# Patient Record
Sex: Female | Born: 2000 | Race: White | Hispanic: No | Marital: Married | Smoking: Current some day smoker
Health system: Southern US, Community
[De-identification: ages and names within clinical notes are randomized; demographics above are authoritative.]

## PROBLEM LIST (undated history)

## (undated) ENCOUNTER — Inpatient Hospital Stay: Payer: Self-pay

## (undated) ENCOUNTER — Inpatient Hospital Stay

## (undated) DIAGNOSIS — G43909 Migraine, unspecified, not intractable, without status migrainosus: Secondary | ICD-10-CM

## (undated) HISTORY — DX: Migraine, unspecified, not intractable, without status migrainosus: G43.909

## (undated) HISTORY — PX: TYMPANOSTOMY TUBE PLACEMENT: SHX32

---
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
## 2020-05-21 DIAGNOSIS — N3000 Acute cystitis without hematuria: Secondary | ICD-10-CM

## 2020-05-21 HISTORY — DX: Acute cystitis without hematuria: N30.00

## 2020-06-08 ENCOUNTER — Other Ambulatory Visit: Payer: Self-pay

## 2020-06-08 ENCOUNTER — Encounter (HOSPITAL_COMMUNITY): Payer: Self-pay

## 2020-06-08 ENCOUNTER — Emergency Department (HOSPITAL_COMMUNITY)
Admission: EM | Admit: 2020-06-08 | Discharge: 2020-06-09 | Disposition: A | Discharge status: Home / Self Care | Payer: Medicaid Other | Attending: Emergency Medicine | Admitting: Emergency Medicine

## 2020-06-08 DIAGNOSIS — N390 Urinary tract infection, site not specified: Secondary | ICD-10-CM | POA: Diagnosis not present

## 2020-06-08 DIAGNOSIS — R102 Pelvic and perineal pain: Secondary | ICD-10-CM | POA: Diagnosis present

## 2020-06-08 DIAGNOSIS — N3000 Acute cystitis without hematuria: Secondary | ICD-10-CM

## 2020-06-08 DIAGNOSIS — F17210 Nicotine dependence, cigarettes, uncomplicated: Secondary | ICD-10-CM | POA: Insufficient documentation

## 2020-06-08 NOTE — ED Triage Notes (Signed)
Pt reports suprapubic abdominal pain for 2 days. Pt also c/o a white discharge for 2 days. Missed last menstrual cycle.

## 2020-06-09 ENCOUNTER — Emergency Department (HOSPITAL_COMMUNITY): Payer: Medicaid Other

## 2020-06-09 LAB — URINALYSIS, ROUTINE W REFLEX MICROSCOPIC
Bilirubin Urine: NEGATIVE
Glucose, UA: NEGATIVE mg/dL
Ketones, ur: NEGATIVE mg/dL
Nitrite: NEGATIVE
Protein, ur: NEGATIVE mg/dL
Specific Gravity, Urine: 1.021 (ref 1.005–1.030)
pH: 6 (ref 5.0–8.0)

## 2020-06-09 LAB — WET PREP, GENITAL
Clue Cells Wet Prep HPF POC: NONE SEEN
Sperm: NONE SEEN
Trich, Wet Prep: NONE SEEN
Yeast Wet Prep HPF POC: NONE SEEN

## 2020-06-09 LAB — GC/CHLAMYDIA PROBE AMP (~~LOC~~) NOT AT ARMC
Chlamydia: NEGATIVE
Comment: NEGATIVE
Comment: NORMAL
Neisseria Gonorrhea: NEGATIVE

## 2020-06-09 LAB — PREGNANCY, URINE: Preg Test, Ur: NEGATIVE

## 2020-06-09 MED ORDER — SULFAMETHOXAZOLE-TRIMETHOPRIM 800-160 MG PO TABS: 1.0000 | ORAL_TABLET | Freq: Two times a day (BID) | ORAL | 0 refills | Status: AC

## 2020-06-09 MED ORDER — CEFTRIAXONE SODIUM 250 MG IJ SOLR
250.0000 mg | Freq: Once | INTRAMUSCULAR | Status: AC
  Administered 2020-06-09: 250 mg via INTRAMUSCULAR
  Filled 2020-06-09: qty 250

## 2020-06-09 MED ORDER — AZITHROMYCIN 250 MG PO TABS
1000.0000 mg | ORAL_TABLET | Freq: Once | ORAL | Status: AC
  Administered 2020-06-09: 1000 mg via ORAL
  Filled 2020-06-09: qty 4

## 2020-06-09 NOTE — ED Notes (Addendum)
Assumed care of this patient. Vitals taken. A&Ox4. NAD. Respirations regular/unlabored. Connected to BP and Pulse Ox. Stretcher low, wheels locked, call bell within reach. Significant other at bedside.
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
## 2020-06-09 NOTE — ED Notes (Signed)
Pelvic exam set up and ready. Pt made aware that urine sample is needed.

## 2020-06-09 NOTE — Discharge Instructions (Addendum)
Take the antibiotic as directed twice daily until gone.   Follow up with your doctor as needed.

## 2020-10-08 ENCOUNTER — Other Ambulatory Visit: Payer: Self-pay

## 2020-10-08 ENCOUNTER — Ambulatory Visit: Payer: Medicaid Other

## 2020-10-08 ENCOUNTER — Ambulatory Visit (LOCAL_COMMUNITY_HEALTH_CENTER): Payer: Medicaid Other | Admitting: Family Medicine

## 2020-10-08 ENCOUNTER — Encounter: Payer: Self-pay | Admitting: Family Medicine

## 2020-10-08 VITALS — BP 111/73 | HR 89 | Temp 98.3°F | Resp 16 | Ht 66.0 in | Wt 179.0 lb

## 2020-10-08 DIAGNOSIS — Z3009 Encounter for other general counseling and advice on contraception: Secondary | ICD-10-CM

## 2020-10-08 DIAGNOSIS — B009 Herpesviral infection, unspecified: Secondary | ICD-10-CM

## 2020-10-08 DIAGNOSIS — Z0000 Encounter for general adult medical examination without abnormal findings: Secondary | ICD-10-CM

## 2020-10-08 DIAGNOSIS — Z30017 Encounter for initial prescription of implantable subdermal contraceptive: Secondary | ICD-10-CM

## 2020-10-08 DIAGNOSIS — F32A Depression, unspecified: Secondary | ICD-10-CM

## 2020-10-08 MED ORDER — ETONOGESTREL 68 MG ~~LOC~~ IMPL
68.0000 mg | DRUG_IMPLANT | Freq: Once | SUBCUTANEOUS | Status: AC
  Administered 2020-10-08: 68 mg via SUBCUTANEOUS

## 2020-10-11 ENCOUNTER — Encounter: Payer: Self-pay | Admitting: Family Medicine

## 2020-10-11 DIAGNOSIS — F32A Depression, unspecified: Secondary | ICD-10-CM

## 2020-10-11 DIAGNOSIS — B009 Herpesviral infection, unspecified: Secondary | ICD-10-CM | POA: Insufficient documentation

## 2020-10-11 HISTORY — DX: Herpesviral infection, unspecified: B00.9

## 2020-10-11 HISTORY — DX: Depression, unspecified: F32.A

## 2020-10-11 MED ORDER — ACYCLOVIR 800 MG PO TABS: 800.0000 mg | ORAL_TABLET | Freq: Two times a day (BID) | ORAL | 12 refills | Status: AC
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
## 2020-10-20 ENCOUNTER — Ambulatory Visit: Payer: Self-pay | Admitting: Licensed Clinical Social Worker
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
## 2020-10-25 ENCOUNTER — Emergency Department (HOSPITAL_COMMUNITY): Payer: No Typology Code available for payment source

## 2020-10-25 ENCOUNTER — Other Ambulatory Visit: Payer: Self-pay

## 2020-10-25 ENCOUNTER — Emergency Department (HOSPITAL_COMMUNITY)
Admission: EM | Admit: 2020-10-25 | Discharge: 2020-10-25 | Disposition: A | Discharge status: Home / Self Care | Payer: No Typology Code available for payment source | Attending: Emergency Medicine | Admitting: Emergency Medicine

## 2020-10-25 DIAGNOSIS — M25571 Pain in right ankle and joints of right foot: Secondary | ICD-10-CM | POA: Diagnosis not present

## 2020-10-25 DIAGNOSIS — Z87891 Personal history of nicotine dependence: Secondary | ICD-10-CM | POA: Insufficient documentation

## 2020-10-25 DIAGNOSIS — R0789 Other chest pain: Secondary | ICD-10-CM | POA: Diagnosis not present

## 2020-10-25 DIAGNOSIS — Y92410 Unspecified street and highway as the place of occurrence of the external cause: Secondary | ICD-10-CM | POA: Diagnosis not present

## 2020-10-25 DIAGNOSIS — R519 Headache, unspecified: Secondary | ICD-10-CM | POA: Insufficient documentation

## 2020-10-25 DIAGNOSIS — S3991XA Unspecified injury of abdomen, initial encounter: Secondary | ICD-10-CM | POA: Insufficient documentation

## 2020-10-25 LAB — COMPREHENSIVE METABOLIC PANEL
ALT: 16 U/L (ref 0–44)
AST: 21 U/L (ref 15–41)
Albumin: 4.1 g/dL (ref 3.5–5.0)
Alkaline Phosphatase: 69 U/L (ref 38–126)
Anion gap: 11 (ref 5–15)
BUN: 11 mg/dL (ref 6–20)
CO2: 24 mmol/L (ref 22–32)
Calcium: 9.5 mg/dL (ref 8.9–10.3)
Chloride: 105 mmol/L (ref 98–111)
Creatinine, Ser: 0.75 mg/dL (ref 0.44–1.00)
GFR, Estimated: >60 mL/min (ref >60)
Glucose, Bld: 100 mg/dL — ABNORMAL HIGH (ref 70–99)
Potassium: 3.8 mmol/L (ref 3.5–5.1)
Sodium: 140 mmol/L (ref 135–145)
Total Bilirubin: 0.8 mg/dL (ref 0.3–1.2)
Total Protein: 7.2 g/dL (ref 6.5–8.1)

## 2020-10-25 LAB — CBC WITH DIFFERENTIAL/PLATELET
Abs Immature Granulocytes: 0.02 10*3/uL (ref 0.00–0.07)
Basophils Absolute: 0 10*3/uL (ref 0.0–0.1)
Basophils Relative: 1 %
Eosinophils Absolute: 0.1 10*3/uL (ref 0.0–0.5)
Eosinophils Relative: 1 %
HCT: 41 % (ref 36.0–46.0)
Hemoglobin: 13.7 g/dL (ref 12.0–15.0)
Immature Granulocytes: 0 %
Lymphocytes Relative: 25 %
Lymphs Abs: 1.9 10*3/uL (ref 0.7–4.0)
MCH: 30 pg (ref 26.0–34.0)
MCHC: 33.4 g/dL (ref 30.0–36.0)
MCV: 89.7 fL (ref 80.0–100.0)
Monocytes Absolute: 0.5 10*3/uL (ref 0.1–1.0)
Monocytes Relative: 7 %
Neutro Abs: 5.3 10*3/uL (ref 1.7–7.7)
Neutrophils Relative %: 66 %
Platelets: 231 10*3/uL (ref 150–400)
RBC: 4.57 MIL/uL (ref 3.87–5.11)
RDW: 12.1 % (ref 11.5–15.5)
WBC: 7.9 10*3/uL (ref 4.0–10.5)
nRBC: 0 % (ref 0.0–0.2)

## 2020-10-25 LAB — I-STAT BETA HCG BLOOD, ED (MC, WL, AP ONLY): I-stat hCG, quantitative: <5 m[IU]/mL (ref <5)

## 2020-10-25 MED ORDER — BACITRACIN ZINC 500 UNIT/GM EX OINT
1.0000 "application " | TOPICAL_OINTMENT | Freq: Once | CUTANEOUS | Status: AC
  Administered 2020-10-25: 1 via TOPICAL
  Filled 2020-10-25: qty 1.8

## 2020-10-25 MED ORDER — NAPROXEN 375 MG PO TABS: 375.0000 mg | ORAL_TABLET | Freq: Two times a day (BID) | ORAL | 0 refills | Status: DC

## 2020-10-25 MED ORDER — CYCLOBENZAPRINE HCL 10 MG PO TABS: 10.0000 mg | ORAL_TABLET | Freq: Two times a day (BID) | ORAL | 0 refills | Status: DC | PRN

## 2020-10-25 MED ORDER — IOHEXOL 350 MG/ML SOLN
75.0000 mL | Freq: Once | INTRAVENOUS | Status: AC | PRN
  Administered 2020-10-25: 75 mL via INTRAVENOUS

## 2020-10-25 MED ORDER — MORPHINE SULFATE (PF) 4 MG/ML IV SOLN
4.0000 mg | Freq: Once | INTRAVENOUS | Status: AC
  Administered 2020-10-25: 4 mg via INTRAVENOUS
  Filled 2020-10-25: qty 1
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
## 2020-10-25 NOTE — ED Notes (Signed)
Pt has gone and come back from ct
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
## 2020-10-25 NOTE — ED Notes (Addendum)
Pt care taken, alert and oriented, waiting on ct scans. Cleaned abraisions

## 2020-10-25 NOTE — Discharge Instructions (Signed)
Take the medications as needed for aches and pains.  Follow-up with your doctor if the symptoms do not resolve over the next week

## 2020-12-22 ENCOUNTER — Ambulatory Visit: Payer: Medicaid Other

## 2020-12-28 ENCOUNTER — Ambulatory Visit (LOCAL_COMMUNITY_HEALTH_CENTER): Payer: Medicaid Other | Admitting: Physician Assistant

## 2020-12-28 ENCOUNTER — Other Ambulatory Visit: Payer: Self-pay

## 2020-12-28 VITALS — BP 110/77 | Ht 66.0 in | Wt 169.0 lb

## 2020-12-28 DIAGNOSIS — Z3046 Encounter for surveillance of implantable subdermal contraceptive: Secondary | ICD-10-CM

## 2020-12-28 DIAGNOSIS — Z3009 Encounter for other general counseling and advice on contraception: Secondary | ICD-10-CM | POA: Diagnosis not present

## 2020-12-29 NOTE — Progress Notes (Signed)
[facility] problem [facility]  Subjective:  Danielle Leon is a 20 y.o. being seen today for Nexplanon removal.   Chief Complaint  Patient presents with   Contraception    Removal    HPI Patient states that she would like the Nexplanon removed to to arm pain at the site.  Patient states that she had Nexplanon in the past and that she had to have it removed due to "non stop bleeding".  Patient states that she plans to use condoms as her [facility] until she decides whether she wants to use another hormonal method.  Does the patient have a current or past history of drug use? No   No components found for: HCV]   Health Maintenance Due  Topic Date Due   HPV VACCINES (1 - 2-dose series) Never done   Hepatitis C Screening  Never done   COVID-19 Vaccine (3 - Booster for Pfizer series) 08/27/2019   INFLUENZA VACCINE  09/20/2020    Review of Systems  All other systems reviewed and are negative.  The following portions of the patient's history were reviewed and updated as appropriate: allergies, current medications, past family history, past medical history, past social history, past surgical history and problem list. Problem list updated.   See flowsheet for other program required questions.  Objective:   Vitals:   12/28/20 0912  BP: 110/77  Weight: 169 lb (76.7 kg)  Height: 5\' 6"  (1.676 m)    Physical Exam Vitals and nursing note reviewed.  Constitutional:      General: She is not in acute distress.    Appearance: Normal appearance.  HENT:     Head: Normocephalic and atraumatic.  Eyes:     Conjunctiva/sclera: Conjunctivae normal.  Pulmonary:     Effort: Pulmonary effort is normal.  Neurological:     Mental Status: She is alert and oriented to person, place, and time.  Psychiatric:        Mood and Affect: Mood normal.        Behavior: Behavior normal.        Thought Content: Thought content normal.        Judgment:  Judgment normal.      Assessment and Plan:  Danielle Leon is a 20 y.o. female presenting to the [facility] Department for a Women's Health problem visit  1. Encounter for counseling regarding contraception Reviewed with patient re: BCM options. Reviewed with patient to call clinic is she decides to use another method. Enc to use OTC spermicide with condoms for added effectiveness if she has one partner. Enc condoms with all sex for STD protection.   2. Nexplanon removal Nexplanon Removal Patient identified, informed consent performed, consent signed.   Appropriate time out taken. Nexplanon site identified.  Area prepped in usual sterile fashon. 3 ml of 1% lidocaine with Epinephrine was used to anesthetize the area at the distal end of the implant and along implant site. A small stab incision was made right beside the implant on the distal portion.  The Nexplanon rod was grasped manually  and removed without difficulty.  There was minimal blood loss. There were no complications.  Steri-strips were applied over the small incision.  A pressure bandage was applied to reduce any bruising.  The patient tolerated the procedure well and was given post procedure instructions.    Counseled patient to take OTC analgesic starting as soon as lidocaine starts to wear off and  take regularly for at least 48 hr to decrease discomfort.  Specifically to take with food or milk to decrease stomach upset and for IB 600 mg (3 tablets) every 6 hrs; IB 800 mg (4 tablets) every 8 hrs; or Aleve 2 tablets every 12 hrs.       Return in about 9 months (around 09/27/2021) for RP and prn.  No future appointments.  Matt Holmes, PA

## 2021-03-06 ENCOUNTER — Ambulatory Visit
Admission: EM | Admit: 2021-03-06 | Discharge: 2021-03-06 | Disposition: A | Discharge status: Home / Self Care | Payer: Medicaid Other | Attending: Emergency Medicine | Admitting: Emergency Medicine

## 2021-03-06 ENCOUNTER — Other Ambulatory Visit: Payer: Self-pay

## 2021-03-06 DIAGNOSIS — U071 COVID-19: Secondary | ICD-10-CM | POA: Diagnosis present

## 2021-03-06 DIAGNOSIS — Z3201 Encounter for pregnancy test, result positive: Secondary | ICD-10-CM

## 2021-03-06 LAB — PREGNANCY, URINE: Preg Test, Ur: POSITIVE — AB

## 2021-03-06 MED ORDER — DOXYLAMINE-PYRIDOXINE 10-10 MG PO TBEC: DELAYED_RELEASE_TABLET | ORAL | 1 refills | Status: DC

## 2021-03-06 MED ORDER — LIDOCAINE VISCOUS HCL 2 % MT SOLN: 15.0000 mL | OROMUCOSAL | 0 refills | Status: DC | PRN
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
## 2021-03-06 NOTE — Discharge Instructions (Signed)
Your COVID test is pending, you will be notified of positive test results  Inside your packet is a list of over-the-counter medications that are safe for you during pregnancy  Please notify your gynecologist that you are pregnant so that you can get an appointment to start prenatal care  Please continue use of your prenatal vitamins  Please follow instructions for your nausea medicine on how to take  May follow up with urgent care as needed

## 2021-03-06 NOTE — ED Triage Notes (Addendum)
Patient is here for "Pregnancy test" & "COVID19 test, Positive today". Pregnancy test at home "Positive, Last Month". Current symptoms "ha, runny nose, s/t" (started today @ 10am). No fever.

## 2021-03-07 LAB — SARS CORONAVIRUS 2 (TAT 6-24 HRS): SARS Coronavirus 2: POSITIVE — AB

## 2021-04-03 DIAGNOSIS — Z3480 Encounter for supervision of other normal pregnancy, unspecified trimester: Secondary | ICD-10-CM | POA: Insufficient documentation

## 2021-04-03 DIAGNOSIS — Z8619 Personal history of other infectious and parasitic diseases: Secondary | ICD-10-CM | POA: Insufficient documentation

## 2021-09-15 DIAGNOSIS — F17200 Nicotine dependence, unspecified, uncomplicated: Secondary | ICD-10-CM | POA: Insufficient documentation

## 2022-02-05 ENCOUNTER — Telehealth: Payer: Medicaid Other | Admitting: Family Medicine

## 2022-02-05 DIAGNOSIS — U071 COVID-19: Secondary | ICD-10-CM

## 2022-02-05 MED ORDER — PREDNISONE 20 MG PO TABS
20.0000 mg | ORAL_TABLET | Freq: Two times a day (BID) | ORAL | 0 refills | Status: AC
Start: 2022-02-05 — End: 2022-02-10

## 2022-02-05 MED ORDER — BENZONATATE 200 MG PO CAPS: 200.0000 mg | ORAL_CAPSULE | Freq: Two times a day (BID) | ORAL | 0 refills | Status: DC | PRN

## 2022-02-05 NOTE — Patient Instructions (Signed)
Quarantine and Isolation Quarantine and isolation refer to local and travel restrictions to protect the public and travelers from contagious diseases that constitute a public health threat. Contagious diseases are diseases that can spread from one person to another. Quarantine and isolation help to protect the public by preventing exposure to people who have or may have a contagious disease. Isolation separates people who are sick with a contagious disease from people who are not sick. Quarantine separates and restricts the movement of people who were exposed to a contagious disease to see if they become sick. You may be put in quarantine or isolation if you have been exposed to or diagnosed with any of the following diseases: Severe acute respiratory syndromes, such as COVID-19. Cholera. Diphtheria. Tuberculosis. Plague. Smallpox. Yellow fever. Viral hemorrhagic fevers, such as [place], Ebola, and [place]. When to quarantine or isolate Follow these rules, whether you have been vaccinated or not: Stay home and isolate from others when you are sick with a contagious disease. Isolate when you test positive for a contagious disease, even if you do not have symptoms. Isolate if you are sick and suspect that you may have a contagious disease. If you suspect that you have a contagious disease, get tested. If your test results are negative, you can end your isolation. If your test results are positive, follow the full isolation recommendations as told by your health care provider or local health authorities. Quarantine and stay away from others when you have been in close contact with someone who has tested positive for a contagious disease. Close contact is defined as being less than 6 ft (1.8 m) away from an infected person for a total of 15 minutes or more over a 24-hour period. Do not go to places where you are unable to wear a mask, such as restaurants and some gyms. Stay home and separate  from others as much as possible. Avoid being around people who may get very sick from the contagious disease that you have. Use a separate bathroom, if possible. Do not travel. For travel guidance, visit the CDC's travel webpage at wwwnc.cdc.gov/travel/ Follow these instructions at home: Medicines  Take over-the-counter and prescription medicines as told by your health care provider. Finish all antibiotic medicine even when you start to feel better. Stay up to date with all your vaccines. Get scheduled vaccines and boosters as recommended by your health care provider. Lifestyle Wear a high-quality mask if you must be around others at home and in public, if recommended. Improve air flow (ventilation) at home to help prevent the disease from spreading to other people, if possible. Do not share personal household items, like cups, towels, and utensils. Practice everyday hygiene and cleaning. General instructions Talk to your health care provider if you have a weakened body defense system (immune system). People with a weakened immune system may have a reduced immune response to vaccines. You may need to follow current prevention measures, including wearing a well-fitting mask, avoiding crowds, and avoiding poorly ventilated indoor places. Monitor symptoms and follow health care provider instructions, which may include resting, drinking fluids, and taking medicines. Follow specific isolation and quarantine recommendations if you are in places that can lead to disease outbreaks, such as correctional and detention facilities, homeless shelters, and cruise ships. Return to your normal activities as told by your health care provider. Ask your health care provider what activities are safe for you. Keep all follow-up visits. This is important. Where to find more information CDC: www.cdc.gov/quarantine/index.html Contact   a health care provider if: You have a fever. You have signs and symptoms that  return or get worse after isolation. Get help right away if: You have difficulty breathing. You have chest pain. These symptoms may be an emergency. Get help right away. Call 911. Do not wait to see if the symptoms will go away. Do not drive yourself to the hospital. Summary Isolation and quarantine help protect the public by preventing exposure to people who have or may have a contagious disease. Isolate when you are sick or when you test positive, even if you do not have symptoms. Quarantine and stay away from others when you have been in close contact with someone who has tested positive for a contagious disease. This information is not intended to replace advice given to you by your health care provider. Make sure you discuss any questions you have with your health care provider. Document Revised: 02/17/2021 Document Reviewed: 01/27/2021 Elsevier Patient Education  2023 [employer] COVID-19 COVID-19, or coronavirus disease 2019, is an infection that is caused by a new (novel) coronavirus called SARS-CoV-2. COVID-19 can cause many symptoms. In some people, the virus may not cause any symptoms. In others, it may cause mild or severe symptoms. Some people with severe infection develop severe disease. What are the causes? This illness is caused by a virus. The virus may be in the air as tiny specks of fluid (aerosols) or droplets, or it may be on surfaces. You may catch the virus by: Breathing in droplets from an infected person. Droplets can be spread by a person breathing, speaking, singing, coughing, or sneezing. Touching something, like a table or a doorknob, that has virus on it (is contaminated) and then touching your mouth, nose, or eyes. What increases the risk? Risk for infection: You are more likely to get infected with the COVID-19 virus if: You are within 6 ft (1.8 m) of a person with COVID-19 for 15 minutes or longer. You are providing care for a person who is infected with  COVID-19. You are in close personal contact with other people. Close personal contact includes hugging, kissing, or sharing eating or drinking utensils. Risk for serious illness caused by COVID-19: You are more likely to get seriously ill from the COVID-19 virus if: You have cancer. You have a long-term (chronic) disease, such as: Chronic lung disease. This includes pulmonary embolism, chronic obstructive pulmonary disease, and cystic fibrosis. Long-term disease that lowers your body's ability to fight infection (immunocompromise). Serious cardiac conditions, such as heart failure, coronary artery disease, or cardiomyopathy. Diabetes. Chronic kidney disease. Liver diseases. These include cirrhosis, nonalcoholic fatty liver disease, alcoholic liver disease, or autoimmune hepatitis. You have obesity. You are pregnant or were recently pregnant. You have sickle cell disease. What are the signs or symptoms? Symptoms of this condition can range from mild to severe. Symptoms may appear any time from 2 to 14 days after being exposed to the virus. They include: Fever or chills. Shortness of breath or trouble breathing. Feeling tired or very tired. Headaches, body aches, or muscle aches. Runny or stuffy nose, sneezing, coughing, or sore throat. New loss of taste or smell. This is rare. Some people may also have stomach problems, such as nausea, vomiting, or diarrhea. Other people may not have any symptoms of COVID-19. How is this diagnosed? This condition may be diagnosed by testing samples to check for the COVID-19 virus. The most common tests are the PCR test and the antigen test. Tests may be done   in the lab or at home. They include: Using a swab to take a sample of fluid from the back of your nose and throat (nasopharyngeal fluid), from your nose, or from your throat. Testing a sample of saliva from your mouth. Testing a sample of coughed-up mucus from your lungs (sputum). How is this  treated? Treatment for COVID-19 infection depends on the severity of the condition. Mild symptoms can be managed at home with rest, fluids, and over-the-counter medicines. Serious symptoms may be treated in a hospital intensive care unit (ICU). Treatment in the ICU may include: Supplemental oxygen. Extra oxygen is given through a tube in the nose, a face mask, or a hood. Medicines. These may include: Antivirals, such as monoclonal antibodies. These help your body fight off certain viruses that can cause disease. Anti-inflammatories, such as corticosteroids. These reduce inflammation and suppress the immune system. Antithrombotics. These prevent or treat blood clots, if they develop. Convalescent plasma. This helps boost your immune system, if you have an underlying immunosuppressive condition or are getting immunosuppressive treatments. Prone positioning. This means you will lie on your stomach. This helps oxygen to get into your lungs. Infection control measures. If you are at risk for more serious illness caused by COVID-19, your health care provider may prescribe two long-acting monoclonal antibodies, given together every 6 months. How is this prevented? To protect yourself: Use preventive medicine (pre-exposure prophylaxis). You may get pre-exposure prophylaxis if you have moderate or severe immunocompromise. Get vaccinated. Anyone 6 months old or older who meets guidelines can get a COVID-19 vaccine or vaccine series. This includes people who are pregnant or making breast milk (lactating). Get an added dose of COVID-19 vaccine after your first vaccine or vaccine series if you have moderate to severe immunocompromise. This applies if you have had a solid organ transplant or have been diagnosed with an immunocompromising condition. You should get the added dose 4 weeks after you got the first COVID-19 vaccine or vaccine series. If you get an mRNA vaccine, you will need a 3-dose primary  series. If you get the J&J/Janssen vaccine, you will need a 2-dose primary series, with the second dose being an mRNA vaccine. Talk to your health care provider about getting experimental monoclonal antibodies. This treatment is approved under emergency use authorization to prevent severe illness before or after being exposed to the COVID-19 virus. You may be given monoclonal antibodies if: You have moderate or severe immunocompromise. This includes treatments that lower your immune response. People with immunocompromise may not develop protection against COVID-19 when they are vaccinated. You cannot be vaccinated. You may not get a vaccine if you have a severe allergic reaction to the vaccine or its components. You are not fully vaccinated. You are in a facility where COVID-19 is present and: Are in close contact with a person who is infected with the COVID-19 virus. Are at high risk of being exposed to the COVID-19 virus. You are at risk of illness from new variants of the COVID-19 virus. To protect others: If you have symptoms of COVID-19, take steps to prevent the virus from spreading to others. Stay home. Leave your house only to get medical care. Do not use public transit, if possible. Do not travel while you are sick. Wash your hands often with soap and water for at least 20 seconds. If soap and water are not available, use alcohol-based hand sanitizer. Make sure that all people in your household wash their hands well and often. Cough or sneeze   into a tissue or your sleeve or elbow. Do not cough or sneeze into your hand or into the air. Where to find more information Centers for Disease Control and Prevention: www.cdc.gov/coronavirus World Health Organization: www.who.int/health-topics/coronavirus Get help right away if: You have trouble breathing. You have pain or pressure in your chest. You are confused. You have bluish lips and fingernails. You have trouble waking from sleep. You  have symptoms that get worse. These symptoms may be an emergency. Get help right away. Call 911. Do not wait to see if the symptoms will go away. Do not drive yourself to the hospital. Summary COVID-19 is an infection that is caused by a new coronavirus. Sometimes, there are no symptoms. Other times, symptoms range from mild to severe. Some people with a severe COVID-19 infection develop severe disease. The virus that causes COVID-19 can spread from person to person through droplets or aerosols from breathing, speaking, singing, coughing, or sneezing. Mild symptoms of COVID-19 can be managed at home with rest, fluids, and over-the-counter medicines. This information is not intended to replace advice given to you by your health care provider. Make sure you discuss any questions you have with your health care provider. Document Revised: 01/25/2021 Document Reviewed: 01/27/2021 Elsevier Patient Education  2023 Else[employer] Inc.  
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
## 2022-03-28 DIAGNOSIS — O99210 Obesity complicating pregnancy, unspecified trimester: Secondary | ICD-10-CM | POA: Insufficient documentation

## 2022-03-28 DIAGNOSIS — O34219 Maternal care for unspecified type scar from previous cesarean delivery: Secondary | ICD-10-CM | POA: Insufficient documentation

## 2022-06-05 ENCOUNTER — Ambulatory Visit
Admission: EM | Admit: 2022-06-05 | Discharge: 2022-06-05 | Disposition: A | Discharge status: Home / Self Care | Payer: Medicaid Other | Attending: Family Medicine | Admitting: Family Medicine

## 2022-06-05 ENCOUNTER — Encounter: Payer: Self-pay | Admitting: Emergency Medicine

## 2022-06-05 DIAGNOSIS — J069 Acute upper respiratory infection, unspecified: Secondary | ICD-10-CM | POA: Diagnosis present

## 2022-06-05 LAB — GROUP A STREP BY PCR: Group A Strep by PCR: NOT DETECTED

## 2022-06-05 NOTE — ED Triage Notes (Signed)
Pt presents with a sore throat and bilateral ear pain that started today.
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
## 2022-06-05 NOTE — Discharge Instructions (Signed)
Strep test is negative.  You can take Tylenol and/or Ibuprofen as needed for fever reduction and pain relief.    For cough: honey 1/2 to 1 teaspoon (you can dilute the honey in water or another fluid).  You can also use guaifenesin and dextromethorphan for cough. You can use a humidifier for chest congestion and cough.  If you don't have a humidifier, you can sit in the bathroom with the hot shower running.      For sore throat: try warm salt water gargles, Mucinex sore throat cough drops or cepacol lozenges, throat spray, warm tea or water with lemon/honey, popsicles or ice, or OTC cold relief medicine for throat discomfort. You can also purchase chloraseptic spray at the pharmacy or dollar store.   For congestion: take a daily anti-histamine like Zyrtec, Claritin, and a oral decongestant, such as pseudoephedrine.  You can also use Flonase 1-2 sprays in each nostril daily. Afrin is also a good option, if you do not have high blood pressure.    It is important to stay hydrated: drink plenty of fluids (water, gatorade/powerade/pedialyte, juices, or teas) to keep your throat moisturized and help further relieve irritation/discomfort.    Return or go to the Emergency Department if symptoms worsen or do not improve in the next few days

## 2022-07-14 ENCOUNTER — Emergency Department: Payer: Medicaid Other

## 2022-07-14 ENCOUNTER — Emergency Department
Admission: EM | Admit: 2022-07-14 | Discharge: 2022-07-14 | Disposition: A | Discharge status: Home / Self Care | Payer: Medicaid Other | Attending: Emergency Medicine | Admitting: Emergency Medicine

## 2022-07-14 ENCOUNTER — Other Ambulatory Visit: Payer: Self-pay

## 2022-07-14 DIAGNOSIS — M545 Low back pain, unspecified: Secondary | ICD-10-CM | POA: Diagnosis not present

## 2022-07-14 DIAGNOSIS — R079 Chest pain, unspecified: Secondary | ICD-10-CM

## 2022-07-14 DIAGNOSIS — M549 Dorsalgia, unspecified: Secondary | ICD-10-CM

## 2022-07-14 DIAGNOSIS — R072 Precordial pain: Secondary | ICD-10-CM | POA: Diagnosis present

## 2022-07-14 LAB — CBC
HCT: 34 % — ABNORMAL LOW (ref 36.0–46.0)
Hemoglobin: 9.9 g/dL — ABNORMAL LOW (ref 12.0–15.0)
MCH: 22.2 pg — ABNORMAL LOW (ref 26.0–34.0)
MCHC: 29.1 g/dL — ABNORMAL LOW (ref 30.0–36.0)
MCV: 76.4 fL — ABNORMAL LOW (ref 80.0–100.0)
Platelets: 530 10*3/uL — ABNORMAL HIGH (ref 150–400)
RBC: 4.45 MIL/uL (ref 3.87–5.11)
RDW: 16.1 % — ABNORMAL HIGH (ref 11.5–15.5)
WBC: 13 10*3/uL — ABNORMAL HIGH (ref 4.0–10.5)
nRBC: 0 % (ref 0.0–0.2)

## 2022-07-14 LAB — BASIC METABOLIC PANEL
Anion gap: 10 (ref 5–15)
BUN: 13 mg/dL (ref 6–20)
CO2: 25 mmol/L (ref 22–32)
Calcium: 9.3 mg/dL (ref 8.9–10.3)
Chloride: 104 mmol/L (ref 98–111)
Creatinine, Ser: 0.61 mg/dL (ref 0.44–1.00)
GFR, Estimated: >60 mL/min (ref >60)
Glucose, Bld: 118 mg/dL — ABNORMAL HIGH (ref 70–99)
Potassium: 3.3 mmol/L — ABNORMAL LOW (ref 3.5–5.1)
Sodium: 139 mmol/L (ref 135–145)

## 2022-07-14 LAB — TROPONIN I (HIGH SENSITIVITY): Troponin I (High Sensitivity): <2 ng/L (ref <18)

## 2022-07-14 MED ORDER — MELOXICAM 15 MG PO TABS: 15.0000 mg | ORAL_TABLET | Freq: Every day | ORAL | 0 refills | Status: AC

## 2022-07-14 MED ORDER — METHOCARBAMOL 500 MG PO TABS: 1000.0000 mg | ORAL_TABLET | Freq: Three times a day (TID) | ORAL | 0 refills | Status: AC | PRN

## 2022-07-14 MED ORDER — PANTOPRAZOLE SODIUM 40 MG PO TBEC: 40.0000 mg | DELAYED_RELEASE_TABLET | Freq: Every day | ORAL | 1 refills | Status: DC

## 2022-07-14 NOTE — ED Provider Notes (Signed)
[facility] Provider Note   Event Date/Time   First MD Initiated Contact with Patient 07/14/22 [phone]     (approximate) History  Chest Pain and Back Pain  HPI Danielle Leon is a 22 y.o. female with a stated past medical history of herpes infection who presents complaining of midepigastric pain that radiates through to her back is associated with generalized back pain.  Patient states that her back hurts from her neck down to the lower back and has for the last 3 days.  Patient has tried ice, IcyHot, and massage with minimal relief.  The chest pain is described as burning, substernal and epigastric, and similar to previous heartburn pain she has had in the past ROS: Patient currently denies any vision changes, tinnitus, difficulty speaking, facial droop, sore throat, shortness of breath, abdominal pain, nausea/vomiting/diarrhea, dysuria, or weakness/numbness/paresthesias in any extremity   Physical Exam  Triage Vital Signs: ED Triage Vitals  Enc Vitals Group     BP 07/14/22 0243 (!) 138/91     Pulse Rate 07/14/22 0243 88     Resp 07/14/22 0243 20     Temp 07/14/22 0243 98.1 F (36.7 C)     Temp Source 07/14/22 0243 Oral     SpO2 07/14/22 0243 97 %     Weight 07/14/22 0242 189 lb (85.7 kg)     Height 07/14/22 0242 5\' 6"  (1.676 m)     Head Circumference --      Peak Flow --      Pain Score 07/14/22 0241 9     Pain Loc --      Pain Edu? --      Excl. in GC? --    Most recent vital signs: Vitals:   07/14/22 0330 07/14/22 0400  BP: 121/85 125/88  Pulse: 63 83  Resp: 18 15  Temp:    SpO2: 99% 100%   General: Awake, oriented x4. CV:  Good peripheral perfusion.  Resp:  Normal effort.  Abd:  No distention.  Other:  Obese young adult Caucasian female asleep in bed in no acute distress.  Easily awoken to voice ED Results / Procedures / Treatments  Labs (all labs ordered are listed, but only abnormal results are displayed) Labs Reviewed  BASIC METABOLIC  PANEL - Abnormal; Notable for the following components:      Result Value   Potassium 3.3 (*)    Glucose, Bld 118 (*)    All other components within normal limits  CBC - Abnormal; Notable for the following components:   WBC 13.0 (*)    Hemoglobin 9.9 (*)    HCT 34.0 (*)    MCV 76.4 (*)    MCH 22.2 (*)    MCHC 29.1 (*)    RDW 16.1 (*)    Platelets 530 (*)    All other components within normal limits  POC URINE PREG, ED  TROPONIN I (HIGH SENSITIVITY)  TROPONIN I (HIGH SENSITIVITY)   EKG ED ECG REPORT I, Merwyn Katos, the attending physician, personally viewed and interpreted this ECG. Date: 07/14/2022 EKG Time: 0242 Rate: 88 Rhythm: normal sinus rhythm QRS Axis: normal Intervals: normal ST/T Wave abnormalities: normal Narrative Interpretation: no evidence of acute ischemia RADIOLOGY ED MD interpretation: 2 view chest x-ray interpreted by me shows no evidence of acute abnormalities including no pneumonia, pneumothorax, or widened mediastinum -Agree with radiology assessment Official radiology report(s): DG Chest 2 View  Result Date: 07/14/2022 CLINICAL DATA:  Chest pain and  back pain. EXAM: CHEST - 2 VIEW COMPARISON:  October 25, 2020 FINDINGS: The heart size and mediastinal contours are within normal limits. Low lung volumes are noted. Both lungs are clear. The visualized skeletal structures are unremarkable. IMPRESSION: No active cardiopulmonary disease. Electronically Signed   By: Aram Candela M.D.   On: 07/14/2022 03:03   PROCEDURES: Critical Care performed: No .1-3 Lead EKG Interpretation  Performed by: Merwyn Katos, MD Authorized by: Merwyn Katos, MD     Interpretation: normal     ECG rate:  71   ECG rate assessment: normal     Rhythm: sinus rhythm     Ectopy: none     Conduction: normal    MEDICATIONS ORDERED IN ED: Medications - No data to display IMPRESSION / MDM / ASSESSMENT AND PLAN / ED COURSE  I reviewed the triage vital signs and the  nursing notes.                             The patient is on the cardiac monitor to evaluate for evidence of arrhythmia and/or significant heart rate changes. Patient's presentation is most consistent with acute presentation with potential threat to life or bodily function. Workup: ECG, CXR, CBC, BMP, Troponin Findings: ECG: No overt evidence of STEMI. No evidence of Brugadas sign, delta wave, epsilon wave, significantly prolonged QTc, or malignant arrhythmia HS Troponin: Negative x1 Other Labs unremarkable for emergent problems. CXR: Without PTX, PNA, or widened mediastinum Last Stress Test: Not on file Last Heart Catheterization: Not on file HEART Score: 2  Given History, Exam, and Workup I have low suspicion for ACS, Pneumothorax, Pneumonia, Pulmonary Embolus, Tamponade, Aortic Dissection or other emergent problem as a cause for this presentation.   Reassesment: Prior to discharge patients pain was controlled and they were well appearing.  Disposition:  Discharge. Strict return precautions discussed with patient with full understanding. Advised patient to follow up promptly with primary care provider    FINAL CLINICAL IMPRESSION(S) / ED DIAGNOSES   Final diagnoses:  Other acute back pain  Chest pain, unspecified type   Rx / DC Orders   ED Discharge Orders          Ordered    Ambulatory referral to Cardiology       Comments: If you have not heard from the Cardiology office within the next 72 hours please call [phone].   07/14/22 0357    meloxicam (MOBIC) 15 MG tablet  Daily        07/14/22 0359    methocarbamol (ROBAXIN) 500 MG tablet  Every 8 hours PRN        07/14/22 0359    pantoprazole (PROTONIX) 40 MG tablet  Daily        07/14/22 0359           Note:  This document was prepared using Dragon voice recognition software and may include unintentional dictation errors.   Merwyn Katos, MD 07/14/22 [phone]

## 2022-07-14 NOTE — ED Triage Notes (Signed)
Pt to ED via POV c/o chest pain and back pain. Pt having central chest pain, sharp in nature that radiates to her back. Pt endorses some SOB while on the drive here and nausea currently. Pt denies dizziness, lightheadedness, fevers, vomiting.

## 2022-09-21 ENCOUNTER — Ambulatory Visit: Payer: Medicaid Other | Admitting: Cardiology

## 2022-09-25 ENCOUNTER — Ambulatory Visit: Payer: Medicaid Other | Admitting: Family Medicine

## 2022-09-25 ENCOUNTER — Other Ambulatory Visit: Payer: Self-pay

## 2022-09-25 ENCOUNTER — Encounter: Payer: Self-pay | Admitting: Family Medicine

## 2022-09-25 ENCOUNTER — Ambulatory Visit: Payer: Medicaid Other

## 2022-09-25 ENCOUNTER — Emergency Department
Admission: EM | Admit: 2022-09-25 | Discharge: 2022-09-25 | Disposition: A | Discharge status: Home / Self Care | Payer: Medicaid Other | Attending: Emergency Medicine | Admitting: Emergency Medicine

## 2022-09-25 ENCOUNTER — Emergency Department: Payer: Medicaid Other

## 2022-09-25 VITALS — BP 115/77 | HR 70 | Ht 65.0 in | Wt 189.6 lb

## 2022-09-25 DIAGNOSIS — Z309 Encounter for contraceptive management, unspecified: Secondary | ICD-10-CM

## 2022-09-25 DIAGNOSIS — S39012A Strain of muscle, fascia and tendon of lower back, initial encounter: Secondary | ICD-10-CM | POA: Insufficient documentation

## 2022-09-25 DIAGNOSIS — Z01419 Encounter for gynecological examination (general) (routine) without abnormal findings: Secondary | ICD-10-CM | POA: Diagnosis not present

## 2022-09-25 DIAGNOSIS — X58XXXA Exposure to other specified factors, initial encounter: Secondary | ICD-10-CM | POA: Insufficient documentation

## 2022-09-25 DIAGNOSIS — Z30432 Encounter for removal of intrauterine contraceptive device: Secondary | ICD-10-CM | POA: Diagnosis not present

## 2022-09-25 DIAGNOSIS — Z113 Encounter for screening for infections with a predominantly sexual mode of transmission: Secondary | ICD-10-CM

## 2022-09-25 DIAGNOSIS — Z3009 Encounter for other general counseling and advice on contraception: Secondary | ICD-10-CM

## 2022-09-25 DIAGNOSIS — S3992XA Unspecified injury of lower back, initial encounter: Secondary | ICD-10-CM | POA: Diagnosis present

## 2022-09-25 LAB — HM HIV SCREENING LAB: HM HIV Screening: NEGATIVE

## 2022-09-25 MED ORDER — NORETHINDRONE 0.35 MG PO TABS
1.0000 | ORAL_TABLET | Freq: Every day | ORAL | 12 refills | Status: DC
Start: 2022-09-25 — End: 2023-12-06

## 2022-09-25 MED ORDER — METHOCARBAMOL 500 MG PO TABS: 500.0000 mg | ORAL_TABLET | Freq: Four times a day (QID) | ORAL | 0 refills | Status: DC

## 2022-09-25 MED ORDER — MELOXICAM 15 MG PO TABS: 15.0000 mg | ORAL_TABLET | Freq: Every day | ORAL | 1 refills | Status: AC

## 2022-09-25 MED ORDER — MELOXICAM 7.5 MG PO TABS
15.0000 mg | ORAL_TABLET | Freq: Once | ORAL | Status: AC
  Administered 2022-09-25: 15 mg via ORAL
  Filled 2022-09-25: qty 2

## 2022-09-25 NOTE — ED Triage Notes (Signed)
Pt presents to ER with c/o new lower back pain that started last night.  Pt states pain starts in her middle back and radiates up her back.  Pt states she feels like "somebody is hitting me in the back w/a sledge hammer."  Pt denies any recent injuries, heavy lifting, or urinary symptoms.  States pain is made better under heat.  Pt otherwise A&O x4 and in NAD at this time.
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
## 2022-09-25 NOTE — ED Provider Notes (Signed)
[facility] Provider Note  Patient Contact: 11:19 PM (approximate)   History   Back Pain   HPI  Danielle Leon is a 22 y.o. female who presents emergency department with back pain.  Patient states that she has a history of off-and-on back pain.  States that she will typically work for a couple days, started experiencing pain.  She has no trauma.  No urinary symptoms, GI symptoms.  Patient states that typically meloxicam and Robaxin worked well for her but she is out of both.  No bilateral dysfunction, saddle anesthesia, paresthesias.     Physical Exam   Triage Vital Signs: ED Triage Vitals  Encounter Vitals Group     BP 09/25/22 1950 130/81     Systolic BP Percentile --      Diastolic BP Percentile --      Pulse Rate 09/25/22 1950 96     Resp 09/25/22 1950 16     Temp 09/25/22 1950 98.3 F (36.8 C)     Temp Source 09/25/22 1950 Oral     SpO2 09/25/22 1950 100 %     Weight --      Height --      Head Circumference --      Peak Flow --      Pain Score 09/25/22 1951 3     Pain Loc --      Pain Education --      Exclude from Growth Chart --     Most recent vital signs: Vitals:   09/25/22 1950  BP: 130/81  Pulse: 96  Resp: 16  Temp: 98.3 F (36.8 C)  SpO2: 100%     General: Alert and in no acute distress.  Cardiovascular:  Good peripheral perfusion Respiratory: Normal respiratory effort without tachypnea or retractions. Lungs CTAB.  Gastrointestinal: Bowel sounds 4 quadrants. Soft and nontender to palpation. No guarding or rigidity. No palpable masses. No distention. No CVA tenderness. Musculoskeletal: Full range of motion to all extremities.  No visible signs of trauma to the lumbar spine.  Tenderness worse on the right paraspinal muscle group than left. Neurologic:  No gross focal neurologic deficits are appreciated.  Skin:   No rash noted Other:   ED Results / Procedures / Treatments   Labs (all labs ordered are listed, but  only abnormal results are displayed) Labs Reviewed - No data to display   EKG     RADIOLOGY  I personally viewed, evaluated, and interpreted these images as part of my medical decision making, as well as reviewing the written report by the radiologist.  ED Provider Interpretation: No acute findings on x-ray of the lumbar spine.  DG Thoracic Spine 2 View  Result Date: 09/25/2022 CLINICAL DATA:  Mid back pain EXAM: THORACIC SPINE 2 VIEWS COMPARISON:  None Available. FINDINGS: There is no evidence of thoracic spine fracture. Alignment is normal. No other significant bone abnormalities are identified. IMPRESSION: Negative. Electronically Signed   By: Darliss Cheney M.D.   On: 09/25/2022 20:39    PROCEDURES:  Critical Care performed: No  Procedures   MEDICATIONS ORDERED IN ED: Medications  meloxicam (MOBIC) tablet 15 mg (15 mg Oral Given 09/25/22 2321)     IMPRESSION / MDM / ASSESSMENT AND PLAN / ED COURSE  I reviewed the triage vital signs and the nursing notes.  Differential diagnosis includes, but is not limited to, lumbar strain, sciatica, cauda equina   Patient's presentation is most consistent with acute presentation with potential threat to life or bodily function.   Patient's diagnosis is consistent with lumbar strain.  Patient presents emergency department complaining of low back pain.  This appears to be an off-and-on issue for the patient.  No recent trauma.  No concerning urinary or GI complaints.  Imaging is reassuring.  Symptom control medication of anti-inflammatory and muscle relaxer will be prescribed for the patient.  Follow-up primary care as needed..  Patient is given ED precautions to return to the ED for any worsening or new symptoms.     FINAL CLINICAL IMPRESSION(S) / ED DIAGNOSES   Final diagnoses:  Strain of lumbar region, initial encounter     Rx / DC Orders   ED Discharge Orders          Ordered    meloxicam  (MOBIC) 15 MG tablet  Daily        09/25/22 2318    methocarbamol (ROBAXIN) 500 MG tablet  4 times daily        09/25/22 2318             Note:  This document was prepared using Dragon voice recognition software and may include unintentional dictation errors.   Lanette Hampshire 09/25/22 2322    Sharyn Creamer, MD 09/26/22 [id number]

## 2022-09-25 NOTE — Progress Notes (Signed)
Pt here for annual physical and IUD removal.  Wants to change to OCPs.  Wet mount results reviewed with patient.  No treatment needed at this time as per standing orders.  Rx for Micronor 1 po daily called to patient's pharmacy.  Family planning packet provided.  Condoms declined.-Collins Scotland, RN

## 2022-09-25 NOTE — ED Notes (Signed)
ED Provider at bedside. 

## 2022-10-03 NOTE — Addendum Note (Signed)
Addended by: Lenice Llamas on: 10/03/2022 03:31 PM   Modules accepted: Level of Service

## 2022-11-16 ENCOUNTER — Ambulatory Visit: Payer: Medicaid Other | Attending: Cardiology | Admitting: Cardiology

## 2022-11-17 ENCOUNTER — Encounter: Payer: Self-pay | Admitting: Cardiology

## 2022-12-19 ENCOUNTER — Other Ambulatory Visit: Payer: Self-pay

## 2022-12-19 ENCOUNTER — Emergency Department
Admission: EM | Admit: 2022-12-19 | Discharge: 2022-12-19 | Disposition: A | Discharge status: Home / Self Care | Payer: Medicaid Other | Attending: Emergency Medicine | Admitting: Emergency Medicine

## 2022-12-19 ENCOUNTER — Emergency Department: Payer: Medicaid Other

## 2022-12-19 DIAGNOSIS — D72829 Elevated white blood cell count, unspecified: Secondary | ICD-10-CM | POA: Diagnosis not present

## 2022-12-19 DIAGNOSIS — R079 Chest pain, unspecified: Secondary | ICD-10-CM

## 2022-12-19 DIAGNOSIS — R0789 Other chest pain: Secondary | ICD-10-CM | POA: Insufficient documentation

## 2022-12-19 LAB — BASIC METABOLIC PANEL
Anion gap: 9 (ref 5–15)
BUN: 11 mg/dL (ref 6–20)
CO2: 26 mmol/L (ref 22–32)
Calcium: 9.3 mg/dL (ref 8.9–10.3)
Chloride: 103 mmol/L (ref 98–111)
Creatinine, Ser: 0.54 mg/dL (ref 0.44–1.00)
GFR, Estimated: >60 mL/min (ref >60)
Glucose, Bld: 97 mg/dL (ref 70–99)
Potassium: 3.7 mmol/L (ref 3.5–5.1)
Sodium: 138 mmol/L (ref 135–145)

## 2022-12-19 LAB — CBC
HCT: 40 % (ref 36.0–46.0)
Hemoglobin: 12.6 g/dL (ref 12.0–15.0)
MCH: 26.1 pg (ref 26.0–34.0)
MCHC: 31.5 g/dL (ref 30.0–36.0)
MCV: 82.8 fL (ref 80.0–100.0)
Platelets: 408 10*3/uL — ABNORMAL HIGH (ref 150–400)
RBC: 4.83 MIL/uL (ref 3.87–5.11)
RDW: 14.6 % (ref 11.5–15.5)
WBC: 15.1 10*3/uL — ABNORMAL HIGH (ref 4.0–10.5)
nRBC: 0 % (ref 0.0–0.2)

## 2022-12-19 LAB — POC URINE PREG, ED: Preg Test, Ur: NEGATIVE

## 2022-12-19 LAB — TROPONIN I (HIGH SENSITIVITY): Troponin I (High Sensitivity): 2 ng/L (ref <18)

## 2022-12-19 NOTE — ED Provider Notes (Signed)
[facility] Provider Note    Event Date/Time   First MD Initiated Contact with Patient 12/19/22 2242     (approximate)  History   Chief Complaint: Chest Pain  HPI  Danielle Leon is a 22 y.o. female with a past medical history of depression, presents to the emergency department for chest pain.  According to the patient she was eating a chocolate bar and a Sunkist, states she turned around and felt a sharp pain in her chest.  Patient states lasted several seconds to a minute or so and then relieved for 10 to 15 minutes and then came back.  Patient was concerned so she came to the emergency department for evaluation.  Patient states she has had a slight cough recently.  No fever no congestion.  Patient has no cardiac history.  Physical Exam   Triage Vital Signs: ED Triage Vitals  Encounter Vitals Group     BP 12/19/22 2037 113/82     Systolic BP Percentile --      Diastolic BP Percentile --      Pulse Rate 12/19/22 2037 79     Resp 12/19/22 2037 16     Temp 12/19/22 2037 98 F (36.7 C)     Temp Source 12/19/22 2037 Oral     SpO2 12/19/22 2037 99 %     Weight 12/19/22 2034 187 lb 6.3 oz (85 kg)     Height 12/19/22 2034 5\' 5"  (1.651 m)     Head Circumference --      Peak Flow --      Pain Score 12/19/22 2034 5     Pain Loc --      Pain Education --      Exclude from Growth Chart --     Most recent vital signs: Vitals:   12/19/22 2037  BP: 113/82  Pulse: 79  Resp: 16  Temp: 98 F (36.7 C)  SpO2: 99%    General: Awake, no distress.  CV:  Good peripheral perfusion.  Regular rate and rhythm  Resp:  Normal effort.  Equal breath sounds bilaterally.  Chest wall is nontender to palpation. Abd:  No distention.  Soft, nontender.  No rebound or guarding.  ED Results / Procedures / Treatments   EKG  I have reviewed and interpreted the EKG.  Normal sinus rhythm at 78 bpm with a narrow QRS, normal axis, normal intervals, no concerning ST  changes.  RADIOLOGY  I have reviewed and interpreted chest x-ray images.  No consolidation on my evaluation.   MEDICATIONS ORDERED IN ED: Medications - No data to display   IMPRESSION / MDM / ASSESSMENT AND PLAN / ED COURSE  I reviewed the triage vital signs and the nursing notes.  Patient's presentation is most consistent with acute presentation with potential threat to life or bodily function.  Patient presents the emergency department for chest pain that started while eating a chocolate bar and drinking a Sunkist beverage.  Patient states she was turning when the pain started.  Possibly indicating more of a muscular cause of the pain or possibly a GI cause to the pain.  Patient's lab work is reassuring besides a mild leukocytosis however largely unchanged from the patient's last WBC 5 months ago.  Patient's chemistry is reassuring.  Troponin is negative.  Chest x-ray is clear and EKG reassuring.  Patient states she is chest pain-free has not had any further discomfort recently.  Highly suspect more GI related discomfort possible  muscular pain.  Given the patient's reassuring workup I believe the patient safe for discharge home with PCP follow-up.  FINAL CLINICAL IMPRESSION(S) / ED DIAGNOSES   Chest pain  Note:  This document was prepared using Dragon voice recognition software and may include unintentional dictation errors.   Minna Antis, MD 12/19/22 2251

## 2022-12-19 NOTE — ED Notes (Signed)
ED Provider at bedside. 

## 2022-12-19 NOTE — ED Triage Notes (Signed)
Pt presents to ER with c/o chest pain that started today around 5-6pm.  Pt states pain is all across the chest.  States pain has been getting worse "when I start getting warm."  Pt states she took some aleve and Advil 30-40 minutes ago.  Pt reports some associated dizziness, feeling light headed and is having some SOB.  Denies cardiac hx.  Pt otherwise A&O x4 and in NAD at this time.

## 2023-01-08 ENCOUNTER — Telehealth: Payer: Medicaid Other

## 2023-01-08 ENCOUNTER — Telehealth: Payer: Medicaid Other | Admitting: Physician Assistant

## 2023-01-08 DIAGNOSIS — B9689 Other specified bacterial agents as the cause of diseases classified elsewhere: Secondary | ICD-10-CM

## 2023-01-08 DIAGNOSIS — J208 Acute bronchitis due to other specified organisms: Secondary | ICD-10-CM

## 2023-01-08 MED ORDER — AZITHROMYCIN 250 MG PO TABS: ORAL_TABLET | ORAL | 0 refills | Status: AC
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
## 2023-01-08 NOTE — Patient Instructions (Signed)
[name], thank you for joining Margaretann Loveless, PA-C for today's virtual visit.  While this provider is not your primary care provider (PCP), if your PCP is located in our provider database this encounter information will be shared with them immediately following your visit.   A [facility] MyChart account gives you access to today's visit and all your visits, tests, and labs performed at [facility] " click here if you don't have a [facility] MyChart account or go to [email or web address]  Consent: (Patient) [name] provided verbal consent for this virtual visit at the beginning of the encounter.  Current Medications:  Current Outpatient Medications:    azithromycin (ZITHROMAX) 250 MG tablet, Take 2 tablets on day 1, then 1 tablet daily on days 2 through 5, Disp: 6 tablet, Rfl: 0   benzonatate (TESSALON) 200 MG capsule, Take 1 capsule (200 mg total) by mouth 2 (two) times daily as needed for cough. (Patient not taking: Reported on 09/25/2022), Disp: 20 capsule, Rfl: 0   Doxylamine-Pyridoxine 10-10 MG TBEC, Two tablets at bedtime on day 1 and 2; if symptoms persist, take 1 tablet in morning and 2 tablets at bedtime on day 3; if symptoms persist, may increase to 1 tablet in morning, 1 tablet mid-afternoon, and 2 tablets at bedtime on day 4 (maximum: doxylamine 40 mg/pyridoxine 40 mg (Patient not taking: Reported on 09/25/2022), Disp: 60 tablet, Rfl: 1   fluticasone (FLONASE) 50 MCG/ACT nasal spray, Place 2 sprays into both nostrils daily. (Patient not taking: Reported on 10/08/2020), Disp: 16 g, Rfl: 0   lidocaine (XYLOCAINE) 2 % solution, Use as directed 15 mLs in the mouth or throat as needed. (Patient not taking: Reported on 09/25/2022), Disp: 100 mL, Rfl: 0   meloxicam (MOBIC) 15 MG tablet, Take 1 tablet (15 mg total) by mouth daily., Disp: 30 tablet, Rfl: 1   methocarbamol (ROBAXIN) 500 MG tablet, Take 1 tablet (500 mg total) by mouth 4 (four) times daily., Disp:  30 tablet, Rfl: 0   naproxen (NAPROSYN) 375 MG tablet, Take 1 tablet (375 mg total) by mouth 2 (two) times daily. (Patient not taking: Reported on 09/25/2022), Disp: 20 tablet, Rfl: 0   norethindrone (ORTHO MICRONOR) 0.35 MG tablet, Take 1 tablet (0.35 mg total) by mouth daily., Disp: 28 tablet, Rfl: 12   pantoprazole (PROTONIX) 40 MG tablet, Take 1 tablet (40 mg total) by mouth daily., Disp: 30 tablet, Rfl: 1   Pediatric Multivitamins-Iron (FLINTSTONES COMPLETE PO), Take by mouth., Disp: , Rfl:    sertraline (ZOLOFT) 50 MG tablet, Take 50 mg by mouth daily., Disp: , Rfl:    Medications ordered in this encounter:  Meds ordered this encounter  Medications   azithromycin (ZITHROMAX) 250 MG tablet    Sig: Take 2 tablets on day 1, then 1 tablet daily on days 2 through 5    Dispense:  6 tablet    Refill:  0    Order Specific Question:   Supervising Provider    Answer:   Merrilee Jansky [[id number]]     *If you need refills on other medications prior to your next appointment, please contact your pharmacy*  Follow-Up: Call back or seek an in-person evaluation if the symptoms worsen or if the condition fails to improve as anticipated.  [facility] [phone]  Other Instructions Upper Respiratory Infection, Adult An upper respiratory infection (URI) is a common viral infection of the nose, throat, and upper air passages that lead to  the lungs. The most common type of URI is the common cold. URIs usually get better on their own, without medical treatment. What are the causes? A URI is caused by a virus. You may catch a virus by: Breathing in droplets from an infected person's cough or sneeze. Touching something that has been exposed to the virus (is contaminated) and then touching your mouth, nose, or eyes. What increases the risk? You are more likely to get a URI if: You are very young or very old. You have close contact with others, such as at work, school, or a health care  facility. You smoke. You have long-term (chronic) heart or lung disease. You have a weakened disease-fighting system (immune system). You have nasal allergies or asthma. You are experiencing a lot of stress. You have poor nutrition. What are the signs or symptoms? A URI usually involves some of the following symptoms: Runny or stuffy (congested) nose. Cough. Sneezing. Sore throat. Headache. Fatigue. Fever. Loss of appetite. Pain in your forehead, behind your eyes, and over your cheekbones (sinus pain). Muscle aches. Redness or irritation of the eyes. Pressure in the ears or face. How is this diagnosed? This condition may be diagnosed based on your medical history and symptoms, and a physical exam. Your health care provider may use a swab to take a mucus sample from your nose (nasal swab). This sample can be tested to determine what virus is causing the illness. How is this treated? URIs usually get better on their own within 7-10 days. Medicines cannot cure URIs, but your health care provider may recommend certain medicines to help relieve symptoms, such as: Over-the-counter cold medicines. Cough suppressants. Coughing is a type of defense against infection that helps to clear the respiratory system, so take these medicines only as recommended by your health care provider. Fever-reducing medicines. Follow these instructions at home: Activity Rest as needed. If you have a fever, stay home from work or school until your fever is gone or until your health care provider says your URI cannot spread to other people (is no longer contagious). Your health care provider may have you wear a face mask to prevent your infection from spreading. Relieving symptoms Gargle with a mixture of salt and water 3-4 times a day or as needed. To make salt water, completely dissolve -1 tsp (3-6 g) of salt in 1 cup (237 mL) of warm water. Use a cool-mist humidifier to add moisture to the air. This can help  you breathe more easily. Eating and drinking  Drink enough fluid to keep your urine pale yellow. Eat soups and other clear broths. General instructions  Take over-the-counter and prescription medicines only as told by your health care provider. These include cold medicines, fever reducers, and cough suppressants. Do not use any products that contain nicotine or tobacco. These products include cigarettes, chewing tobacco, and vaping devices, such as e-cigarettes. If you need help quitting, ask your health care provider. Stay away from secondhand smoke. Stay up to date on all immunizations, including the yearly (annual) flu vaccine. Keep all follow-up visits. This is important. How to prevent the spread of infection to others URIs can be contagious. To prevent the infection from spreading: Wash your hands with soap and water for at least 20 seconds. If soap and water are not available, use hand sanitizer. Avoid touching your mouth, face, eyes, or nose. Cough or sneeze into a tissue or your sleeve or elbow instead of into your hand or into the air.  Contact a health care provider if: You are getting worse instead of better. You have a fever or chills. Your mucus is brown or red. You have yellow or brown discharge coming from your nose. You have pain in your face, especially when you bend forward. You have swollen neck glands. You have pain while swallowing. You have white areas in the back of your throat. Get help right away if: You have shortness of breath that gets worse. You have severe or persistent: Headache. Ear pain. Sinus pain. Chest pain. You have chronic lung disease along with any of the following: Making high-pitched whistling sounds when you breathe, most often when you breathe out (wheezing). Prolonged cough (more than 14 days). Coughing up blood. A change in your usual mucus. You have a stiff neck. You have changes in  your: Vision. Hearing. Thinking. Mood. These symptoms may be an emergency. Get help right away. Call 911. Do not wait to see if the symptoms will go away. Do not drive yourself to the hospital. Summary An upper respiratory infection (URI) is a common infection of the nose, throat, and upper air passages that lead to the lungs. A URI is caused by a virus. URIs usually get better on their own within 7-10 days. Medicines cannot cure URIs, but your health care provider may recommend certain medicines to help relieve symptoms. This information is not intended to replace advice given to you by your health care provider. Make sure you discuss any questions you have with your health care provider. Document Revised: 09/08/2020 Document Reviewed: 09/08/2020 Elsevier Patient Education  2024 [employer] Inc.    If you have been instructed to have an in-person evaluation today at a local Urgent Care facility, please use the link below. It will take you to a list of all of our available [facility], including address, phone number and hours of operation. Please do not delay care.  [facility]  If you or a family member do not have a primary care provider, use the link below to schedule a visit and establish care. When you choose a [facility] primary care physician or advanced practice provider, you gain a long-term partner in health. Find a Primary Care Provider  Learn more about [facility]'s in-office and virtual care options: [facility] - Get Care Now

## 2023-01-23 ENCOUNTER — Telehealth: Payer: Medicaid Other

## 2023-01-24 ENCOUNTER — Telehealth: Payer: Medicaid Other | Admitting: Physician Assistant

## 2023-01-24 DIAGNOSIS — J019 Acute sinusitis, unspecified: Secondary | ICD-10-CM

## 2023-01-24 DIAGNOSIS — J9801 Acute bronchospasm: Secondary | ICD-10-CM | POA: Diagnosis not present

## 2023-01-24 DIAGNOSIS — B9689 Other specified bacterial agents as the cause of diseases classified elsewhere: Secondary | ICD-10-CM | POA: Diagnosis not present

## 2023-01-24 MED ORDER — DOXYCYCLINE HYCLATE 100 MG PO TABS: 100.0000 mg | ORAL_TABLET | Freq: Two times a day (BID) | ORAL | 0 refills | Status: DC

## 2023-01-24 MED ORDER — PROMETHAZINE-DM 6.25-15 MG/5ML PO SYRP: 5.0000 mL | ORAL_SOLUTION | Freq: Four times a day (QID) | ORAL | 0 refills | Status: DC | PRN

## 2023-01-24 MED ORDER — ALBUTEROL SULFATE HFA 108 (90 BASE) MCG/ACT IN AERS: 2.0000 | INHALATION_SPRAY | Freq: Four times a day (QID) | RESPIRATORY_TRACT | 0 refills | Status: DC | PRN

## 2023-01-24 MED ORDER — FLUTICASONE PROPIONATE 50 MCG/ACT NA SUSP: 2.0000 | Freq: Every day | NASAL | 0 refills | Status: DC
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
## 2023-01-24 NOTE — Patient Instructions (Signed)
Danielle Leon, thank you for joining [name], PA-C for today's virtual visit.  While this provider is not your primary care provider (PCP), if your PCP is located in our provider database this encounter information will be shared with them immediately following your visit.   A [facility] MyChart account gives you access to today's visit and all your visits, tests, and labs performed at [facility] " click here if you don't have a [facility] MyChart account or go to [email or web address]  Consent: (Patient) Danielle Leon provided verbal consent for this virtual visit at the beginning of the encounter.  Current Medications:  Current Outpatient Medications:    benzonatate (TESSALON) 200 MG capsule, Take 1 capsule (200 mg total) by mouth 2 (two) times daily as needed for cough. (Patient not taking: Reported on 09/25/2022), Disp: 20 capsule, Rfl: 0   Doxylamine-Pyridoxine 10-10 MG TBEC, Two tablets at bedtime on day 1 and 2; if symptoms persist, take 1 tablet in morning and 2 tablets at bedtime on day 3; if symptoms persist, may increase to 1 tablet in morning, 1 tablet mid-afternoon, and 2 tablets at bedtime on day 4 (maximum: doxylamine 40 mg/pyridoxine 40 mg (Patient not taking: Reported on 09/25/2022), Disp: 60 tablet, Rfl: 1   fluticasone (FLONASE) 50 MCG/ACT nasal spray, Place 2 sprays into both nostrils daily. (Patient not taking: Reported on 10/08/2020), Disp: 16 g, Rfl: 0   lidocaine (XYLOCAINE) 2 % solution, Use as directed 15 mLs in the mouth or throat as needed. (Patient not taking: Reported on 09/25/2022), Disp: 100 mL, Rfl: 0   meloxicam (MOBIC) 15 MG tablet, Take 1 tablet (15 mg total) by mouth daily., Disp: 30 tablet, Rfl: 1   methocarbamol (ROBAXIN) 500 MG tablet, Take 1 tablet (500 mg total) by mouth 4 (four) times daily., Disp: 30 tablet, Rfl: 0   naproxen (NAPROSYN) 375 MG tablet, Take 1 tablet (375 mg total) by mouth 2 (two) times daily. (Patient not  taking: Reported on 09/25/2022), Disp: 20 tablet, Rfl: 0   norethindrone (ORTHO MICRONOR) 0.35 MG tablet, Take 1 tablet (0.35 mg total) by mouth daily., Disp: 28 tablet, Rfl: 12   pantoprazole (PROTONIX) 40 MG tablet, Take 1 tablet (40 mg total) by mouth daily., Disp: 30 tablet, Rfl: 1   Pediatric Multivitamins-Iron (FLINTSTONES COMPLETE PO), Take by mouth., Disp: , Rfl:    sertraline (ZOLOFT) 50 MG tablet, Take 50 mg by mouth daily., Disp: , Rfl:    Medications ordered in this encounter:  No orders of the defined types were placed in this encounter.    *If you need refills on other medications prior to your next appointment, please contact your pharmacy*  Follow-Up: Call back or seek an in-person evaluation if the symptoms worsen or if the condition fails to improve as anticipated.  [facility] [phone]  Other Instructions Please take antibiotic as directed.  Increase fluid intake.  Use Saline nasal spray.  Take a daily multivitamin. Use the Flonase, cough syrup and albuterol as directed. Continue Mucinex OTC.  Place a humidifier in the bedroom.  Please call or return clinic if symptoms are not improving.  Sinusitis Sinusitis is redness, soreness, and swelling (inflammation) of the paranasal sinuses. Paranasal sinuses are air pockets within the bones of your face (beneath the eyes, the middle of the forehead, or above the eyes). In healthy paranasal sinuses, mucus is able to drain out, and air is able to circulate through them by way of your  nose. However, when your paranasal sinuses are inflamed, mucus and air can become trapped. This can allow bacteria and other germs to grow and cause infection. Sinusitis can develop quickly and last only a short time (acute) or continue over a long period (chronic). Sinusitis that lasts for more than 12 weeks is considered chronic.  CAUSES  Causes of sinusitis include: Allergies. Structural abnormalities, such as displacement of the  cartilage that separates your nostrils (deviated septum), which can decrease the air flow through your nose and sinuses and affect sinus drainage. Functional abnormalities, such as when the small hairs (cilia) that line your sinuses and help remove mucus do not work properly or are not present. SYMPTOMS  Symptoms of acute and chronic sinusitis are the same. The primary symptoms are pain and pressure around the affected sinuses. Other symptoms include: Upper toothache. Earache. Headache. Bad breath. Decreased sense of smell and taste. A cough, which worsens when you are lying flat. Fatigue. Fever. Thick drainage from your nose, which often is green and may contain pus (purulent). Swelling and warmth over the affected sinuses. DIAGNOSIS  Your caregiver will perform a physical exam. During the exam, your caregiver may: Look in your nose for signs of abnormal growths in your nostrils (nasal polyps). Tap over the affected sinus to check for signs of infection. View the inside of your sinuses (endoscopy) with a special imaging device with a light attached (endoscope), which is inserted into your sinuses. If your caregiver suspects that you have chronic sinusitis, one or more of the following tests may be recommended: Allergy tests. Nasal culture A sample of mucus is taken from your nose and sent to a lab and screened for bacteria. Nasal cytology A sample of mucus is taken from your nose and examined by your caregiver to determine if your sinusitis is related to an allergy. TREATMENT  Most cases of acute sinusitis are related to a viral infection and will resolve on their own within 10 days. Sometimes medicines are prescribed to help relieve symptoms (pain medicine, decongestants, nasal steroid sprays, or saline sprays).  However, for sinusitis related to a bacterial infection, your caregiver will prescribe antibiotic medicines. These are medicines that will help kill the bacteria causing the  infection.  Rarely, sinusitis is caused by a fungal infection. In theses cases, your caregiver will prescribe antifungal medicine. For some cases of chronic sinusitis, surgery is needed. Generally, these are cases in which sinusitis recurs more than 3 times per year, despite other treatments. HOME CARE INSTRUCTIONS  Drink plenty of water. Water helps thin the mucus so your sinuses can drain more easily. Use a humidifier. Inhale steam 3 to 4 times a day (for example, sit in the bathroom with the shower running). Apply a warm, moist washcloth to your face 3 to 4 times a day, or as directed by your caregiver. Use saline nasal sprays to help moisten and clean your sinuses. Take over-the-counter or prescription medicines for pain, discomfort, or fever only as directed by your caregiver. SEEK IMMEDIATE MEDICAL CARE IF: You have increasing pain or severe headaches. You have nausea, vomiting, or drowsiness. You have swelling around your face. You have vision problems. You have a stiff neck. You have difficulty breathing. MAKE SURE YOU:  Understand these instructions. Will watch your condition. Will get help right away if you are not doing well or get worse. Document Released: 02/06/2005 Document Revised: 05/01/2011 Document Reviewed: 02/21/2011 [facility] Patient Information 2014 [place], [place].    If you have been  instructed to have an in-person evaluation today at a local Urgent Care facility, please use the link below. It will take you to a list of all of our available [facility], including address, phone number and hours of operation. Please do not delay care.  [facility]  If you or a family member do not have a primary care provider, use the link below to schedule a visit and establish care. When you choose a [facility] primary care physician or advanced practice provider, you gain a long-term partner in health. Find a Primary Care Provider  Learn more about  [facility]'s in-office and virtual care options:  - Get Care Now

## 2023-02-05 ENCOUNTER — Emergency Department: Payer: Medicaid Other

## 2023-02-05 ENCOUNTER — Emergency Department
Admission: EM | Admit: 2023-02-05 | Discharge: 2023-02-05 | Disposition: A | Discharge status: Home / Self Care | Payer: Medicaid Other | Attending: Emergency Medicine | Admitting: Emergency Medicine

## 2023-02-05 ENCOUNTER — Other Ambulatory Visit: Payer: Self-pay

## 2023-02-05 DIAGNOSIS — K2900 Acute gastritis without bleeding: Secondary | ICD-10-CM

## 2023-02-05 DIAGNOSIS — R079 Chest pain, unspecified: Secondary | ICD-10-CM | POA: Diagnosis present

## 2023-02-05 DIAGNOSIS — R0789 Other chest pain: Secondary | ICD-10-CM | POA: Diagnosis not present

## 2023-02-05 DIAGNOSIS — K2970 Gastritis, unspecified, without bleeding: Secondary | ICD-10-CM | POA: Insufficient documentation

## 2023-02-05 LAB — CBC
HCT: 38.1 % (ref 36.0–46.0)
Hemoglobin: 12.4 g/dL (ref 12.0–15.0)
MCH: 27.7 pg (ref 26.0–34.0)
MCHC: 32.5 g/dL (ref 30.0–36.0)
MCV: 85 fL (ref 80.0–100.0)
Platelets: 349 10*3/uL (ref 150–400)
RBC: 4.48 MIL/uL (ref 3.87–5.11)
RDW: 13.4 % (ref 11.5–15.5)
WBC: 9.2 10*3/uL (ref 4.0–10.5)
nRBC: 0 % (ref 0.0–0.2)

## 2023-02-05 LAB — BASIC METABOLIC PANEL
Anion gap: 9 (ref 5–15)
BUN: 14 mg/dL (ref 6–20)
CO2: 23 mmol/L (ref 22–32)
Calcium: 9.1 mg/dL (ref 8.9–10.3)
Chloride: 104 mmol/L (ref 98–111)
Creatinine, Ser: 0.59 mg/dL (ref 0.44–1.00)
GFR, Estimated: >60 mL/min (ref >60)
Glucose, Bld: 119 mg/dL — ABNORMAL HIGH (ref 70–99)
Potassium: 3.5 mmol/L (ref 3.5–5.1)
Sodium: 136 mmol/L (ref 135–145)

## 2023-02-05 LAB — TROPONIN I (HIGH SENSITIVITY): Troponin I (High Sensitivity): <2 ng/L (ref <18)

## 2023-02-05 LAB — POC URINE PREG, ED: Preg Test, Ur: NEGATIVE

## 2023-02-05 MED ORDER — FAMOTIDINE 20 MG PO TABS: 20.0000 mg | ORAL_TABLET | Freq: Two times a day (BID) | ORAL | 0 refills | Status: AC

## 2023-02-05 MED ORDER — METOCLOPRAMIDE HCL 10 MG PO TABS: 10.0000 mg | ORAL_TABLET | Freq: Four times a day (QID) | ORAL | 0 refills | Status: DC | PRN

## 2023-02-05 MED ORDER — SUCRALFATE 1 G PO TABS: 1.0000 g | ORAL_TABLET | Freq: Four times a day (QID) | ORAL | 1 refills | Status: DC

## 2023-02-05 NOTE — ED Triage Notes (Signed)
Pt to ED via POV c/o CP that woke her up out of her sleep around 1am and back pain that has been going on for a couple days. Pt reports pain is all over chest. Denies SOB, fevers, dizziness

## 2023-02-05 NOTE — ED Provider Notes (Signed)
[facility] Provider Note    Event Date/Time   First MD Initiated Contact with Patient 02/05/23 [id number]     (approximate)   History   Chief Complaint: Chest Pain and Back Pain   HPI  NDEA MOSKO is a 22 y.o. female with a past history of depression, migraine who comes ED complaining of central chest pain described as aching that started at 1:00 AM, waking the patient up from sleep.  No shortness of breath fever dizziness or palpitation.  No vomiting or diaphoresis.  Nonradiating.  Worse laying down, better sitting upright.  Has been on NSAIDs and steroids recently for bronchitis.  Pain is currently resolved.          Physical Exam   Triage Vital Signs: ED Triage Vitals  Encounter Vitals Group     BP 02/05/23 0314 123/88     Systolic BP Percentile --      Diastolic BP Percentile --      Pulse Rate 02/05/23 0314 100     Resp 02/05/23 0314 20     Temp 02/05/23 0314 97.9 F (36.6 C)     Temp Source 02/05/23 0314 Oral     SpO2 02/05/23 0314 95 %     Weight 02/05/23 0312 180 lb (81.6 kg)     Height 02/05/23 0312 5\' 6"  (1.676 m)     Head Circumference --      Peak Flow --      Pain Score 02/05/23 0312 8     Pain Loc --      Pain Education --      Exclude from Growth Chart --     Most recent vital signs: Vitals:   02/05/23 0314 02/05/23 0723  BP: 123/88 108/76  Pulse: 100 90  Resp: 20 16  Temp: 97.9 F (36.6 C) 97.6 F (36.4 C)  SpO2: 95% 99%    General: Awake, no distress.  CV:  Good peripheral perfusion.  Regular rate and rhythm Resp:  Normal effort.  Clear to auscultation bilaterally Abd:  No distention.  Soft with mild left upper quadrant tenderness Other:  No lower extremity edema or calf tenderness.   ED Results / Procedures / Treatments   Labs (all labs ordered are listed, but only abnormal results are displayed) Labs Reviewed  BASIC METABOLIC PANEL - Abnormal; Notable for the following components:      Result Value    Glucose, Bld 119 (*)    All other components within normal limits  CBC  POC URINE PREG, ED  TROPONIN I (HIGH SENSITIVITY)  TROPONIN I (HIGH SENSITIVITY)     EKG Interpreted by me Sinus rhythm rate of 95.  Normal axis intervals QRS ST segments and T waves   RADIOLOGY Chest x-ray interpreted by me, appears normal.  Radiology report reviewed   PROCEDURES:  Procedures   MEDICATIONS ORDERED IN ED: Medications - No data to display   IMPRESSION / MDM / ASSESSMENT AND PLAN / ED COURSE  I reviewed the triage vital signs and the nursing notes.  DDx: GERD/gastritis, anxiety, pneumothorax, AKI, unlikely non-STEMI.  Doubt PE, dissection, pericardial effusion  Patient's presentation is most consistent with acute presentation with potential threat to life or bodily function.  Patient presents with atypical chest discomfort, currently resolved.  Vital signs and exam are normal.  EKG chest x-ray and labs are all normal.  No further cardiac workup indicated.  Stable for discharge, treat with antacids and primary care follow-up  FINAL CLINICAL IMPRESSION(S) / ED DIAGNOSES   Final diagnoses:  Atypical chest pain  Acute gastritis without hemorrhage, unspecified gastritis type     Rx / DC Orders   ED Discharge Orders          Ordered    famotidine (PEPCID) 20 MG tablet  2 times daily        02/05/23 0733    metoCLOPramide (REGLAN) 10 MG tablet  Every 6 hours PRN        02/05/23 0733    sucralfate (CARAFATE) 1 g tablet  4 times daily        02/05/23 8657             Note:  This document was prepared using Dragon voice recognition software and may include unintentional dictation errors.   Sharman Cheek, MD 02/05/23 [phone]

## 2023-02-18 ENCOUNTER — Ambulatory Visit
Admission: EM | Admit: 2023-02-18 | Discharge: 2023-02-18 | Disposition: A | Discharge status: Home / Self Care | Payer: Medicaid Other | Attending: Emergency Medicine | Admitting: Emergency Medicine

## 2023-02-18 ENCOUNTER — Encounter: Payer: Self-pay | Admitting: Emergency Medicine

## 2023-02-18 DIAGNOSIS — J069 Acute upper respiratory infection, unspecified: Secondary | ICD-10-CM

## 2023-02-18 MED ORDER — BENZONATATE 100 MG PO CAPS: 200.0000 mg | ORAL_CAPSULE | Freq: Three times a day (TID) | ORAL | 0 refills | Status: DC

## 2023-02-18 MED ORDER — IPRATROPIUM BROMIDE 0.06 % NA SOLN: 2.0000 | Freq: Four times a day (QID) | NASAL | 12 refills | Status: DC

## 2023-02-18 MED ORDER — PROMETHAZINE-DM 6.25-15 MG/5ML PO SYRP: 5.0000 mL | ORAL_SOLUTION | Freq: Four times a day (QID) | ORAL | 0 refills | Status: DC | PRN

## 2023-02-18 MED ORDER — DOXYCYCLINE HYCLATE 100 MG PO CAPS: 100.0000 mg | ORAL_CAPSULE | Freq: Two times a day (BID) | ORAL | 0 refills | Status: AC

## 2023-02-18 NOTE — Discharge Instructions (Signed)
Take the doxycycline twice daily with food for 7 days for treatment of your upper respiratory infection.  Use over-the-counter Tylenol and/or ibuprofen according the package instructions as needed for any fever or pain.  Use the Atrovent nasal spray, 2 squirts in each nostril every 6 hours, as needed for runny nose and postnasal drip.  Use the Tessalon Perles every 8 hours during the day.  Take them with a small sip of water.  They may give you some numbness to the base of your tongue or a metallic taste in your mouth, this is normal.  Use the Promethazine DM cough syrup at bedtime for cough and congestion.  It will make you drowsy so do not take it during the day.  Return for reevaluation or see your primary care provider for any new or worsening symptoms.

## 2023-02-18 NOTE — ED Triage Notes (Signed)
Patient c/o cough and chest congestion for 3-4 weeks ago.  Patient unsure of fevers.
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
## 2023-02-19 ENCOUNTER — Telehealth: Payer: Medicaid Other | Admitting: Physician Assistant

## 2023-02-19 ENCOUNTER — Other Ambulatory Visit: Payer: Self-pay

## 2023-02-19 ENCOUNTER — Emergency Department
Admission: EM | Admit: 2023-02-19 | Discharge: 2023-02-19 | Disposition: A | Discharge status: Home / Self Care | Payer: Medicaid Other | Attending: Emergency Medicine | Admitting: Emergency Medicine

## 2023-02-19 DIAGNOSIS — J111 Influenza due to unidentified influenza virus with other respiratory manifestations: Secondary | ICD-10-CM

## 2023-02-19 DIAGNOSIS — Z20822 Contact with and (suspected) exposure to covid-19: Secondary | ICD-10-CM | POA: Insufficient documentation

## 2023-02-19 DIAGNOSIS — R059 Cough, unspecified: Secondary | ICD-10-CM | POA: Diagnosis present

## 2023-02-19 DIAGNOSIS — J09X2 Influenza due to identified novel influenza A virus with other respiratory manifestations: Secondary | ICD-10-CM | POA: Insufficient documentation

## 2023-02-19 DIAGNOSIS — R051 Acute cough: Secondary | ICD-10-CM

## 2023-02-19 DIAGNOSIS — R112 Nausea with vomiting, unspecified: Secondary | ICD-10-CM | POA: Diagnosis not present

## 2023-02-19 LAB — RESP PANEL BY RT-PCR (RSV, FLU A&B, COVID)  RVPGX2
Influenza A by PCR: POSITIVE — AB
Influenza B by PCR: NEGATIVE
Resp Syncytial Virus by PCR: NEGATIVE
SARS Coronavirus 2 by RT PCR: NEGATIVE

## 2023-02-19 MED ORDER — ACETAMINOPHEN 325 MG PO TABS
650.0000 mg | ORAL_TABLET | Freq: Once | ORAL | Status: AC
  Administered 2023-02-19: 650 mg via ORAL
  Filled 2023-02-19: qty 2

## 2023-02-19 MED ORDER — OSELTAMIVIR PHOSPHATE 75 MG PO CAPS: 75.0000 mg | ORAL_CAPSULE | Freq: Two times a day (BID) | ORAL | 0 refills | Status: AC

## 2023-02-19 MED ORDER — ONDANSETRON 4 MG PO TBDP: 4.0000 mg | ORAL_TABLET | Freq: Three times a day (TID) | ORAL | 0 refills | Status: DC | PRN

## 2023-02-19 NOTE — ED Provider Triage Note (Signed)
Emergency Medicine Provider Triage Evaluation Note  Danielle Leon , a 22 y.o. female  was evaluated in triage.  Pt complains of cough congestion, fever, body aches, other roommates have same.  Review of Systems  Positive:  Negative:   Physical Exam  BP 117/79   Pulse (!) 126   Temp (!) 100.8 F (38.2 C)   Resp 18   LMP 02/14/2023 (Approximate)   SpO2 97%  Gen:   Awake, no distress   Resp:  Normal effort  MSK:   Moves extremities without difficulty  Other:    Medical Decision Making  Medically screening exam initiated at 12:10 PM.  Appropriate orders placed.  Danielle Leon was informed that the remainder of the evaluation will be completed by another provider, this initial triage assessment does not replace that evaluation, and the importance of remaining in the ED until their evaluation is complete.     Faythe Ghee, PA-C 02/19/23 1210

## 2023-02-19 NOTE — ED Provider Notes (Signed)
[facility] Emergency Department Provider Note     Event Date/Time   First MD Initiated Contact with Patient 02/19/23 1319     (approximate)   History   flu like symptoms   HPI  Danielle Leon is a 22 y.o. female present to the ED for evaluation of NV, cough, and bodyaches, with onset yesterday. She was evaluated at [facility] yesterday, but viral panel testing was not done. She give reports of several weeks of symptoms, and was started on antibiotics.   Physical Exam   Triage Vital Signs: ED Triage Vitals [02/19/23 1210]  Encounter Vitals Group     BP 117/79     Systolic BP Percentile      Diastolic BP Percentile      Pulse Rate (!) 126     Resp 18     Temp (!) 100.8 F (38.2 C)     Temp src      SpO2 97 %     Weight      Height      Head Circumference      Peak Flow      Pain Score 8     Pain Loc      Pain Education      Exclude from Growth Chart     Most recent vital signs: Vitals:   02/19/23 1210  BP: 117/79  Pulse: (!) 126  Resp: 18  Temp: (!) 100.8 F (38.2 C)  SpO2: 97%    General Awake, no distress. NAD HEENT NCAT. PERRL. EOMI. No rhinorrhea. Mucous membranes are moist.  CV:  Good peripheral perfusion.  RESP:  Normal effort. CTA ABD:  No distention. NAD   ED Results / Procedures / Treatments   Labs (all labs ordered are listed, but only abnormal results are displayed) Labs Reviewed  RESP PANEL BY RT-PCR (RSV, FLU A&B, COVID)  RVPGX2 - Abnormal; Notable for the following components:      Result Value   Influenza A by PCR POSITIVE (*)    All other components within normal limits     EKG   RADIOLOGY   No results found.   PROCEDURES:  Critical Care performed: No  Procedures   MEDICATIONS ORDERED IN ED: Medications  acetaminophen (TYLENOL) tablet 650 mg (650 mg Oral Given 02/19/23 1213)     IMPRESSION / MDM / ASSESSMENT AND PLAN / ED COURSE  I reviewed the triage vital signs and the nursing  notes.                              Differential diagnosis includes, but is not limited to, covid, flu, RSV, viral URI  Patient's presentation is most consistent with acute complicated illness / injury requiring diagnostic workup.  Patient's diagnosis is consistent with influenza A. Patient will be discharged home with prescriptions for tamiflu. Patient is to follow up with her PCP or a local [facility] as needed or otherwise directed. Patient is given ED precautions to return to the ED for any worsening or new symptoms.   FINAL CLINICAL IMPRESSION(S) / ED DIAGNOSES   Final diagnoses:  Influenza     Rx / DC Orders   ED Discharge Orders          Ordered    oseltamivir (TAMIFLU) 75 MG capsule  2 times daily        02/19/23 1356  Note:  This document was prepared using Dragon voice recognition software and may include unintentional dictation errors.    Lissa Hoard, PA-C 02/19/23 1415    Jene Every, MD 02/19/23 [phone]

## 2023-02-19 NOTE — Progress Notes (Signed)
E-Visit for Cough   We are sorry that you are not feeling well.  Here is how we plan to help!  Based on your presentation I believe you most likely have A cough due to a virus.  This is called viral bronchitis and is best treated by rest, plenty of fluids and control of the cough.  You may use Ibuprofen or Tylenol as directed to help your symptoms.     In addition you may use A non-prescription cough medication called Mucinex DM: take 2 tablets every 12 hours.  From your responses in the eVisit questionnaire you describe inflammation in the upper respiratory tract which is causing a significant cough.  This is commonly called Bronchitis and has four common causes:   Allergies Viral Infections Acid Reflux Bacterial Infection Allergies, viruses and acid reflux are treated by controlling symptoms or eliminating the cause. An example might be a cough caused by taking certain blood pressure medications. You stop the cough by changing the medication. Another example might be a cough caused by acid reflux. Controlling the reflux helps control the cough.  I have prescribed Zofran 4mg  for nausea and vomiting.  A work note has been provided for you today. It will be available under "letters" in your MyChart account.     HOME CARE Only take medications as instructed by your medical team. Complete the entire course of an antibiotic. Drink plenty of fluids and get plenty of rest. Avoid close contacts especially the very young and the elderly Cover your mouth if you cough or cough into your sleeve. Always remember to wash your hands A steam or ultrasonic humidifier can help congestion.   GET HELP RIGHT AWAY IF: You develop worsening fever. You become short of breath You cough up blood. Your symptoms persist after you have completed your treatment plan MAKE SURE YOU  Understand these instructions. Will watch your condition. Will get help right away if you are not doing well or get worse.     Thank you for choosing an e-visit.  Your e-visit answers were reviewed by a board certified advanced clinical practitioner to complete your personal care plan. Depending upon the condition, your plan could have included both over the counter or prescription medications.  Please review your pharmacy choice. Make sure the pharmacy is open so you can pick up prescription now. If there is a problem, you may contact your provider through [employer] and have the prescription routed to another pharmacy.  Your safety is important to [place]. If you have drug allergies check your prescription carefully.   For the next 24 hours you can use MyChart to ask questions about today's visit, request a non-urgent call back, or ask for a work or school excuse. You will get an email in the next two days asking about your experience. I hope that your e-visit has been valuable and will speed your recovery.   I have spent 5 minutes in review of e-visit questionnaire, review and updating patient chart, medical decision making and response to patient.   Margaretann Loveless, PA-C

## 2023-02-19 NOTE — ED Triage Notes (Signed)
Pt comes with vomiting, cough, body aches that started this morning.

## 2023-02-19 NOTE — Discharge Instructions (Addendum)
Take the previously prescribed meds. Take the Tamiflu as directed. Follow-up with your PCP or a local urgent care center as needed.

## 2023-03-02 ENCOUNTER — Encounter: Payer: Self-pay | Admitting: Emergency Medicine

## 2023-03-02 ENCOUNTER — Ambulatory Visit
Admission: EM | Admit: 2023-03-02 | Discharge: 2023-03-02 | Disposition: A | Discharge status: Home / Self Care | Payer: Medicaid Other | Attending: Emergency Medicine | Admitting: Emergency Medicine

## 2023-03-02 DIAGNOSIS — H66002 Acute suppurative otitis media without spontaneous rupture of ear drum, left ear: Secondary | ICD-10-CM | POA: Diagnosis not present

## 2023-03-02 MED ORDER — AZITHROMYCIN 250 MG PO TABS: 250.0000 mg | ORAL_TABLET | Freq: Every day | ORAL | 0 refills | Status: DC

## 2023-03-02 NOTE — Discharge Instructions (Signed)
 Take the Azithromycin daily for 5 days with food for treatment of your ear infection.  Take an over-the-counter probiotic 1 hour after each dose of antibiotic to prevent diarrhea.  Use over-the-counter Tylenol and ibuprofen as needed for pain or fever.  Place a hot water bottle, or heating pad, underneath your pillowcase at night to help dilate up your ear and aid in pain relief as well as resolution of the infection.  Return for reevaluation for any new or worsening symptoms.

## 2023-03-02 NOTE — ED Triage Notes (Signed)
Patient c/o left ear pain that started today.  Patient denies fevers.
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
## 2023-05-28 ENCOUNTER — Ambulatory Visit: Attending: Internal Medicine | Admitting: Physical Therapy

## 2023-05-28 DIAGNOSIS — R293 Abnormal posture: Secondary | ICD-10-CM | POA: Insufficient documentation

## 2023-05-28 DIAGNOSIS — M5459 Other low back pain: Secondary | ICD-10-CM | POA: Diagnosis present

## 2023-05-28 DIAGNOSIS — M6281 Muscle weakness (generalized): Secondary | ICD-10-CM | POA: Diagnosis present

## 2023-05-29 ENCOUNTER — Encounter: Payer: Self-pay | Admitting: Physical Therapy

## 2023-05-29 DIAGNOSIS — F32A Depression, unspecified: Secondary | ICD-10-CM | POA: Insufficient documentation
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
## 2023-05-30 ENCOUNTER — Ambulatory Visit: Admitting: Physical Therapy

## 2023-05-30 DIAGNOSIS — M5459 Other low back pain: Secondary | ICD-10-CM

## 2023-05-30 DIAGNOSIS — M6281 Muscle weakness (generalized): Secondary | ICD-10-CM

## 2023-05-30 DIAGNOSIS — R293 Abnormal posture: Secondary | ICD-10-CM
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
## 2023-06-04 ENCOUNTER — Ambulatory Visit: Admitting: Physical Therapy

## 2023-06-04 DIAGNOSIS — M5459 Other low back pain: Secondary | ICD-10-CM

## 2023-06-04 DIAGNOSIS — R293 Abnormal posture: Secondary | ICD-10-CM

## 2023-06-04 DIAGNOSIS — M6281 Muscle weakness (generalized): Secondary | ICD-10-CM
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
## 2023-06-06 ENCOUNTER — Encounter: Payer: Self-pay | Admitting: Physical Therapy

## 2023-06-06 ENCOUNTER — Ambulatory Visit: Admitting: Physical Therapy

## 2023-06-06 DIAGNOSIS — M5459 Other low back pain: Secondary | ICD-10-CM

## 2023-06-06 DIAGNOSIS — M6281 Muscle weakness (generalized): Secondary | ICD-10-CM

## 2023-06-06 DIAGNOSIS — R293 Abnormal posture: Secondary | ICD-10-CM
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
## 2023-06-08 ENCOUNTER — Telehealth: Payer: Self-pay

## 2023-06-08 NOTE — Telephone Encounter (Signed)
 Pt contact PT office notifying of a positive pregnancy test. She has an appointment 06/14/23 with [facility] MFM to establish care for a high risk pregnancy and would like to know if she should stop using the TENS unit for her back pain. PT advised pt to stop using her TENS unit until she speaks with her MFM provider at her appointment to discuss next week. Pt in agreement.  Danielle Leon D Jamichael Knotts PT, DPT, [facility]

## 2023-06-11 ENCOUNTER — Ambulatory Visit: Admitting: Physical Therapy

## 2023-06-11 DIAGNOSIS — K819 Cholecystitis, unspecified: Secondary | ICD-10-CM | POA: Insufficient documentation

## 2023-06-11 HISTORY — PX: CHOLECYSTECTOMY: SHX55

## 2023-06-13 ENCOUNTER — Ambulatory Visit: Admitting: Physical Therapy

## 2023-06-18 ENCOUNTER — Ambulatory Visit: Admitting: Physical Therapy

## 2023-06-18 DIAGNOSIS — Z8659 Personal history of other mental and behavioral disorders: Secondary | ICD-10-CM | POA: Insufficient documentation

## 2023-06-18 DIAGNOSIS — Z8751 Personal history of pre-term labor: Secondary | ICD-10-CM | POA: Insufficient documentation

## 2023-06-18 DIAGNOSIS — O09299 Supervision of pregnancy with other poor reproductive or obstetric history, unspecified trimester: Secondary | ICD-10-CM

## 2023-06-20 ENCOUNTER — Ambulatory Visit: Admitting: Physical Therapy

## 2023-06-25 ENCOUNTER — Ambulatory Visit: Admitting: Physical Therapy

## 2023-06-27 ENCOUNTER — Encounter: Admitting: Physical Therapy

## 2023-07-02 ENCOUNTER — Ambulatory Visit: Attending: Internal Medicine | Admitting: Physical Therapy

## 2023-07-02 DIAGNOSIS — M6281 Muscle weakness (generalized): Secondary | ICD-10-CM | POA: Insufficient documentation

## 2023-07-02 DIAGNOSIS — M5459 Other low back pain: Secondary | ICD-10-CM | POA: Insufficient documentation

## 2023-07-02 DIAGNOSIS — R293 Abnormal posture: Secondary | ICD-10-CM | POA: Insufficient documentation
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
## 2023-07-04 ENCOUNTER — Encounter: Payer: Self-pay | Admitting: Physical Therapy

## 2023-07-04 ENCOUNTER — Ambulatory Visit

## 2023-07-04 DIAGNOSIS — M6281 Muscle weakness (generalized): Secondary | ICD-10-CM

## 2023-07-04 DIAGNOSIS — R293 Abnormal posture: Secondary | ICD-10-CM | POA: Diagnosis present

## 2023-07-04 DIAGNOSIS — M5459 Other low back pain: Secondary | ICD-10-CM | POA: Diagnosis present

## 2023-07-24 LAB — PANORAMA PRENATAL TEST FULL PANEL:PANORAMA TEST PLUS 5 ADDITIONAL MICRODELETIONS: FETAL FRACTION: 6.4

## 2023-12-03 NOTE — Procedures (Signed)
-------------------------------------------------------------------------------   Attestation signed by Zahn, Katelin Marie, MD at 12/06/23 [phone] Attending Physician Attestation:  I have personally reviewed the fetal non stress test and agree with the documentation.  Katelin M Zahn, MD 12/06/23  7:23 AM   -------------------------------------------------------------------------------  NST Interpretation Indication: Uterine contractions: O47.9   Baseline: 145 bpm Variability: moderate Accelerations: present Decelerations: absent Contractions: none Time noted:  See OBIX Impression: reactive Authenticated by: Edison Halter, MD   Edison Halter, MD 12/03/23  10:57 PM

## 2023-12-05 ENCOUNTER — Encounter: Payer: Self-pay | Admitting: Obstetrics and Gynecology

## 2023-12-05 ENCOUNTER — Observation Stay
Admission: EM | Admit: 2023-12-05 | Discharge: 2023-12-06 | Disposition: A | Discharge status: Home / Self Care | Attending: Certified Nurse Midwife | Admitting: Certified Nurse Midwife

## 2023-12-05 DIAGNOSIS — Z3A30 30 weeks gestation of pregnancy: Secondary | ICD-10-CM | POA: Insufficient documentation

## 2023-12-05 DIAGNOSIS — O99343 Other mental disorders complicating pregnancy, third trimester: Secondary | ICD-10-CM | POA: Insufficient documentation

## 2023-12-05 DIAGNOSIS — M62838 Other muscle spasm: Principal | ICD-10-CM | POA: Insufficient documentation

## 2023-12-05 DIAGNOSIS — F32A Depression, unspecified: Secondary | ICD-10-CM | POA: Insufficient documentation

## 2023-12-05 DIAGNOSIS — O26893 Other specified pregnancy related conditions, third trimester: Principal | ICD-10-CM | POA: Insufficient documentation

## 2023-12-05 NOTE — OB Triage Note (Signed)
 Pt is G3P2 at at [redacted]w[redacted]d with c/o cramping x many days. Seen at [facility] for care. States was there yesterday and they didn't do anything pu a urine test and check her cervix. She does not feel any better. Here tonight for reevaluation. FHR 160

## 2023-12-06 DIAGNOSIS — M62838 Other muscle spasm: Principal | ICD-10-CM | POA: Diagnosis present

## 2023-12-06 DIAGNOSIS — F32A Depression, unspecified: Secondary | ICD-10-CM | POA: Diagnosis not present

## 2023-12-06 DIAGNOSIS — Z3A30 30 weeks gestation of pregnancy: Secondary | ICD-10-CM | POA: Diagnosis not present

## 2023-12-06 DIAGNOSIS — O26893 Other specified pregnancy related conditions, third trimester: Secondary | ICD-10-CM | POA: Diagnosis present

## 2023-12-06 DIAGNOSIS — O99343 Other mental disorders complicating pregnancy, third trimester: Secondary | ICD-10-CM | POA: Diagnosis not present

## 2023-12-06 LAB — URINALYSIS, ROUTINE W REFLEX MICROSCOPIC
Bilirubin Urine: NEGATIVE
Glucose, UA: NEGATIVE mg/dL
Hgb urine dipstick: NEGATIVE
Ketones, ur: NEGATIVE mg/dL
Nitrite: NEGATIVE
Protein, ur: NEGATIVE mg/dL
Specific Gravity, Urine: 1.014 (ref 1.005–1.030)
pH: 6 (ref 5.0–8.0)

## 2023-12-06 NOTE — Discharge Summary (Signed)
 Patient ID: CLISTA RAINFORD MRN: [id number] DOB/AGE: 12-Nov-2000 23 y.o.  Admit date: 12/05/2023 Discharge date: 12/06/2023  Admission Diagnoses: 23yo G4P2 at [redacted]w[redacted]d presents with complaints of a muscle spasm which occurs every 30 mins. Was seen yesterday at [facility] triage for the same symptom and was given flexeril .   Discharge Diagnoses: Muscle spasm  Factors complicating pregnancy: HSV Previous c/s Obesity H/o PTL Depression  Prenatal Procedures: NST  Consults: None  Significant Diagnostic Studies:  No results found for this or any previous visit (from the past week).  Treatments: Given abdominal binder  Hospital Course:  This is a 23 y.o. H5E8987 with IUP at [redacted]w[redacted]d seen for abdominal muscle spasms.  Discussed comfort measures  She was observed, fetal heart rate monitoring remained reassuring, and she had no signs/symptoms of  preterm labor or other maternal-fetal concerns.  She was deemed stable for discharge to home with outpatient follow up.  Discharge Physical Exam:  Temp 98.4 F (36.9 C) (Oral)   Resp 16   Ht 5' 6 (1.676 m)   Wt 86.2 kg   LMP 02/14/2023 (Approximate)   BMI 30.67 kg/m   NST: FHR baseline: 150 bpm Variability: moderate Accelerations: yes Decelerations: none Category/reactivity: reactive   TOCO: quiet SVE: deferred      Discharge Condition: Stable  Disposition: Discharge disposition: 01-Home or Self Care        Allergies as of 12/06/2023       Reactions   Penicillins Hives   Amoxicillin    Prenatal Vitamins Rash   Not allergic to Flinstones gummy vitamins        Medication List     STOP taking these medications    azithromycin  250 MG tablet Commonly known as: Zithromax  Z-Pak   norethindrone  0.35 MG tablet Commonly known as: Ortho Micronor    sucralfate  1 g tablet Commonly known as: Carafate        TAKE these medications    famotidine  20 MG tablet Commonly known as: PEPCID  Take 1 tablet (20 mg total) by mouth  2 (two) times daily.   FLINTSTONES COMPLETE PO Take by mouth.   fluticasone  50 MCG/ACT nasal spray Commonly known as: FLONASE  Place 2 sprays into both nostrils daily.   methocarbamol  500 MG tablet Commonly known as: ROBAXIN  Take 1 tablet (500 mg total) by mouth 4 (four) times daily.   metoCLOPramide  10 MG tablet Commonly known as: REGLAN  Take 1 tablet (10 mg total) by mouth every 6 (six) hours as needed.   ondansetron  4 MG disintegrating tablet Commonly known as: ZOFRAN -ODT Take 1 tablet (4 mg total) by mouth every 8 (eight) hours as needed.   sertraline 50 MG tablet Commonly known as: ZOLOFT Take 50 mg by mouth daily.        Follow-up Information     [facility] Follow up.   Why: Keep all scheduled appointments Contact information: [facility] [address] [place] [address] [phone]                 Signed:  DELON COE, CNM 12/06/2023 12:27 AM

## 2023-12-06 NOTE — OB Triage Note (Signed)
 Pt provided with abd binder and states it does make her pain feel better. She walked about the room with it and it gave her support and relief. She has a regular OB appointment this am

## 2023-12-06 NOTE — Progress Notes (Signed)
 Outpatient OB Note: Return OB visit  ASSESSMENT AND PLAN:   Problem List Items Addressed This Visit       Other   HSV-1 (herpes simplex virus 1) infection   Obesity in pregnancy, antepartum, unspecified trimester (HHS-HCC) - Primary   Previous cesarean delivery affecting pregnancy (HHS-HCC)    Return in about 2 weeks (around 12/20/2023).   Kavita S Arora, MD    See problem list overview sections for additional prenatal documentation.   SUBJECTIVE:   23 y.o. H4E8877 at [redacted]w[redacted]d here today for a return OB visit. Reviewed prenatal record.   Patient reports: Movement: Present Loss of Fluid: No Bleeding: No Contractions: Regular   OBJECTIVE:   Patient Vitals: Pulse: 92 BP: 108/79 Total weight gain: 4.944 kg (10 lb 14.4 oz)   Neg nitrazine/pooling  See prenatal flowsheet and problem list for details of this visit.   General Appearance  No acute distress, well appearing, and well nourished Pulmonary   Normal work of breathing Neurologic   Alert and oriented to person, place, and time Psychiatric   Mood and affect within normal limits  A chaperone was present for any sensitive portions of the exam (if performed) including but not limited to, breast and pelvic examination. If performed, consent was obtained from patient prior to the sensitive examination.  If medical students were involved, explicit additional consent was obtained.

## 2023-12-06 NOTE — Progress Notes (Signed)
 HPI  Has had multiple ED visits in last few days. Is having a lot of pain, her abdomen feels like a rock. Had some type of gush yesterday. Fetus is much more active. No bleeding. Very tender by area by her preivosu cesarean. No ruq pain, vision changes, lightheadeness   Flexural (given 2 nights ago, not helping), prenatal, sertraline, tylenol .    Has felt pain like this before with preious child at ~36 weeks.    Check cervix again today, they told her she's at 1 cm before.

## 2023-12-13 ENCOUNTER — Encounter: Payer: Self-pay | Admitting: Obstetrics and Gynecology

## 2023-12-13 ENCOUNTER — Observation Stay
Admission: EM | Admit: 2023-12-13 | Discharge: 2023-12-14 | Disposition: A | Discharge status: Home / Self Care | Attending: Obstetrics | Admitting: Obstetrics

## 2023-12-13 ENCOUNTER — Other Ambulatory Visit: Payer: Self-pay

## 2023-12-13 DIAGNOSIS — Z3A31 31 weeks gestation of pregnancy: Secondary | ICD-10-CM | POA: Diagnosis not present

## 2023-12-13 DIAGNOSIS — O09893 Supervision of other high risk pregnancies, third trimester: Secondary | ICD-10-CM

## 2023-12-13 DIAGNOSIS — O99210 Obesity complicating pregnancy, unspecified trimester: Secondary | ICD-10-CM | POA: Diagnosis present

## 2023-12-13 DIAGNOSIS — B9689 Other specified bacterial agents as the cause of diseases classified elsewhere: Secondary | ICD-10-CM | POA: Diagnosis present

## 2023-12-13 DIAGNOSIS — O4700 False labor before 37 completed weeks of gestation, unspecified trimester: Secondary | ICD-10-CM | POA: Diagnosis present

## 2023-12-13 DIAGNOSIS — O4703 False labor before 37 completed weeks of gestation, third trimester: Principal | ICD-10-CM | POA: Insufficient documentation

## 2023-12-13 DIAGNOSIS — O34219 Maternal care for unspecified type scar from previous cesarean delivery: Secondary | ICD-10-CM | POA: Diagnosis present

## 2023-12-13 DIAGNOSIS — R8789 Other abnormal findings in specimens from female genital organs: Secondary | ICD-10-CM

## 2023-12-13 DIAGNOSIS — O09899 Supervision of other high risk pregnancies, unspecified trimester: Secondary | ICD-10-CM

## 2023-12-13 DIAGNOSIS — O09299 Supervision of pregnancy with other poor reproductive or obstetric history, unspecified trimester: Secondary | ICD-10-CM

## 2023-12-13 LAB — URINALYSIS, COMPLETE (UACMP) WITH MICROSCOPIC
Bilirubin Urine: NEGATIVE
Glucose, UA: NEGATIVE mg/dL
Hgb urine dipstick: NEGATIVE
Ketones, ur: 5 mg/dL — AB
Leukocytes,Ua: NEGATIVE
Nitrite: NEGATIVE
Protein, ur: NEGATIVE mg/dL
Specific Gravity, Urine: 1.024 (ref 1.005–1.030)
pH: 5 (ref 5.0–8.0)

## 2023-12-13 LAB — URINE DRUG SCREEN, QUALITATIVE (ARMC ONLY)
Amphetamines, Ur Screen: NOT DETECTED
Barbiturates, Ur Screen: NOT DETECTED
Benzodiazepine, Ur Scrn: NOT DETECTED
Cannabinoid 50 Ng, Ur ~~LOC~~: NOT DETECTED
Cocaine Metabolite,Ur ~~LOC~~: NOT DETECTED
MDMA (Ecstasy)Ur Screen: NOT DETECTED
Methadone Scn, Ur: NOT DETECTED
Opiate, Ur Screen: NOT DETECTED
Phencyclidine (PCP) Ur S: NOT DETECTED
Tricyclic, Ur Screen: POSITIVE — AB

## 2023-12-13 LAB — WET PREP, GENITAL
Sperm: NONE SEEN
Trich, Wet Prep: NONE SEEN
WBC, Wet Prep HPF POC: >=10 — AB (ref <10)
Yeast Wet Prep HPF POC: NONE SEEN

## 2023-12-13 LAB — CHLAMYDIA/NGC RT PCR (ARMC ONLY)
Chlamydia Tr: NOT DETECTED
N gonorrhoeae: NOT DETECTED

## 2023-12-13 LAB — FETAL FIBRONECTIN: Fetal Fibronectin: POSITIVE — AB

## 2023-12-13 MED ORDER — ACETAMINOPHEN 500 MG PO TABS: 1000.0000 mg | ORAL_TABLET | Freq: Four times a day (QID) | ORAL | Status: DC | PRN

## 2023-12-13 MED ORDER — SERTRALINE HCL 50 MG PO TABS
50.0000 mg | ORAL_TABLET | Freq: Every day | ORAL | Status: DC
Start: 2023-12-14 — End: 2023-12-14
  Filled 2023-12-13: qty 1

## 2023-12-13 MED ORDER — FAMOTIDINE 20 MG PO TABS
20.0000 mg | ORAL_TABLET | Freq: Two times a day (BID) | ORAL | Status: DC
  Administered 2023-12-13: 20 mg via ORAL
  Filled 2023-12-13: qty 1

## 2023-12-13 MED ORDER — CALCIUM CARBONATE ANTACID 500 MG PO CHEW: 2.0000 | CHEWABLE_TABLET | ORAL | Status: DC | PRN

## 2023-12-13 MED ORDER — METRONIDAZOLE 500 MG PO TABS
500.0000 mg | ORAL_TABLET | Freq: Two times a day (BID) | ORAL | Status: DC
  Administered 2023-12-13 – 2023-12-14 (×2): 500 mg via ORAL
  Filled 2023-12-13 (×2): qty 1

## 2023-12-13 NOTE — OB Triage Note (Signed)
 Patient reports still feeling contractions, but not as often. Reports approx 5-10 mins.

## 2023-12-13 NOTE — OB Triage Note (Signed)
 Pt reports to labor and delivery with complaints of contractions and losing her mucous plug. This started at about 4:30pm today. States that she was told to come in when she lost her mucous plug. Denies vaginal bleeding and LOF. States positive fetal movement.   She is planning to have a repeat c-section with this delivery.   EFM and toco applied and assessing.

## 2023-12-13 NOTE — OB Triage Note (Signed)
 Patient reports she is not currently having pain from contractions. Has occasional pain across mid abdomen that she rates 7/10 when she has it. Also has intermittent sharp pain in vagina. EFM continuous.
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
## 2023-12-14 DIAGNOSIS — O4703 False labor before 37 completed weeks of gestation, third trimester: Secondary | ICD-10-CM | POA: Diagnosis not present

## 2023-12-14 MED ORDER — METRONIDAZOLE 500 MG PO TABS: 500.0000 mg | ORAL_TABLET | Freq: Two times a day (BID) | ORAL | 0 refills | Status: AC

## 2023-12-14 NOTE — Discharge Summary (Signed)
 RN reviewed discharge instructions with patient. Gave patient opportunity for questions. All questions answered at this time. Pt verbalized understanding. Pt discharged home with her support person.

## 2023-12-14 NOTE — Discharge Instructions (Signed)
 PRETERM LABOR: Includes any of the following symptoms that occur between 20-[redacted] weeks gestation. If these symptoms are not stopped, preterm labor can result in preterm delivery, placing your baby at risk.  Notify your doctor if any of the following occur: 1. Menstrual-like cramps   5. Pelvic pressure  2. Uterine contractions. These may be painless and feel like the uterus is tightening or the baby is balling up 6. Increase or change in vaginal discharge  3. Low, dull backache, unrelieved by heat or Tylenol   7. Vaginal bleeding  4. Intestinal cramps, with our without diarrhea, sometimes 8. A general feeling that something is not right   9. Leaking of fluid described as gas pain

## 2023-12-14 NOTE — Discharge Summary (Signed)
 Danielle Leon is a 23 y.o. female. She is at [redacted]w[redacted]d gestation. Patient's last menstrual period was 02/14/2023 (approximate). 02/11/2024, by Other Basis   Prenatal care site: [facility]  Chief complaint: lost mucus plug and contractions   Admission Diagnoses:  1) intrauterine pregnancy at [redacted]w[redacted]d  2) Preterm contractions [O47.00] 3) Positive FFN   Discharge Diagnoses:  Active Problems:   Obesity in pregnancy, antepartum, unspecified trimester   Previous cesarean delivery affecting pregnancy   History of maternal third degree perineal laceration, currently pregnant   Preterm contractions   Positive fetal fibronectin at 22 weeks to [redacted] weeks gestation   Bacterial vaginosis in pregnancy   History of preterm delivery, currently pregnant in third trimester   HPI: Danielle Leon is a 23 y.o. [id number] at [redacted]w[redacted]d admitted for positive FFN.  She presented to L&D via EMS with complaints of losing her mucus plug and then contractions that started after. She currently receives prenatal care with [facility]. Her pregnancy is complicated by obesity, previous cesarean delivery d/t breech presentation, history of preterm labor at 36 weeks, history of third degree perineal laceration, depression, and history of genital HSV-1. She denies LOF or vaginal bleeding. She reports contractions were every 2-3 minutes after she initially had the mucus discharge. Endorses good fetal movement. Contractions are less frequent now but she continues to have sharp, shooting vaginal pain frequently.   S: Resting comfortably. no CTX, no VB.no LOF,  Active fetal movement.   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    Past Surgical Hx:  has a past surgical history that includes Tympanostomy tube placement (age 59) and Cesarean section (09/14/2021).   Allergies  Allergen  Reactions   Penicillins Hives   Amoxicillin    Prenatal Vitamins Rash    Not allergic to Flinstones gummy vitamins     Prior to Admission medications   Medication Sig Start Date End Date Taking? Authorizing Provider  famotidine  (PEPCID ) 20 MG tablet Take 1 tablet (20 mg total) by mouth 2 (two) times daily. 02/05/23 03/07/23  Viviann Pastor, MD  metroNIDAZOLE  (FLAGYL ) 500 MG tablet Take 1 tablet (500 mg total) by mouth every 12 (twelve) hours for 6 days. 12/14/23 12/20/23  Vernel Therisa HERO, CNM  Pediatric Multivitamins-Iron [facility] COMPLETE PO) Take by mouth.    [provider]  sertraline (ZOLOFT) 50 MG tablet Take 50 mg by mouth daily.    [provider]    Social History: She  reports that she has been smoking cigarettes. She started smoking about 4 years ago. She has a 0.5 pack-year smoking history. She has been exposed to tobacco smoke. She has never used smokeless tobacco. She reports current alcohol use of about 1.0 standard drink of alcohol per week. She reports that she does not currently use drugs.  Family History: family history includes Anemia in her mother; Autism in her brother and brother; Healthy in her maternal grandmother and son; Heart attack in her mother; Hypertension in her father; Intellectual disability in her brother; Macrocephaly in her brother; Stroke in her maternal grandfather and mother.   Review of Systems: A full review of systems was performed and negative except as noted in the HPI.     Pertinent Results:   O:  BP 92/60   Pulse 89   Temp 97.7 F (36.5 C) (Oral)   Resp 18  Ht 5' 6 (1.676 m)   Wt 86.2 kg   LMP 02/14/2023 (Approximate)   BMI 30.67 kg/m  Results for orders placed or performed during the hospital encounter of 12/13/23 (from the past 48 hours)  Wet prep, genital   Collection Time: 12/13/23  7:14 PM  Result Value Ref Range   Yeast Wet Prep HPF POC NONE SEEN NONE SEEN   Trich, Wet Prep NONE SEEN NONE SEEN    Clue Cells Wet Prep HPF POC PRESENT (A) NONE SEEN   WBC, Wet Prep HPF POC >=10 (A) <10   Sperm NONE SEEN   Chlamydia/NGC rt PCR ([facility] only)   Collection Time: 12/13/23  7:14 PM   Specimen: GU  Result Value Ref Range   Specimen source GC/Chlam ENDOCERVICAL    Chlamydia Tr NOT DETECTED NOT DETECTED   N gonorrhoeae NOT DETECTED NOT DETECTED  Fetal fibronectin   Collection Time: 12/13/23  7:14 PM  Result Value Ref Range   Fetal Fibronectin POSITIVE (A) NEGATIVE  Urinalysis, Complete w Microscopic -Urine, Clean Catch   Collection Time: 12/13/23  7:14 PM  Result Value Ref Range   Color, Urine YELLOW (A) YELLOW   APPearance HAZY (A) CLEAR   Specific Gravity, Urine 1.024 1.005 - 1.030   pH 5.0 5.0 - 8.0   Glucose, UA NEGATIVE NEGATIVE mg/dL   Hgb urine dipstick NEGATIVE NEGATIVE   Bilirubin Urine NEGATIVE NEGATIVE   Ketones, ur 5 (A) NEGATIVE mg/dL   Protein, ur NEGATIVE NEGATIVE mg/dL   Nitrite NEGATIVE NEGATIVE   Leukocytes,Ua NEGATIVE NEGATIVE   RBC / HPF 6-10 0 - 5 RBC/hpf   WBC, UA 0-5 0 - 5 WBC/hpf   Bacteria, UA RARE (A) NONE SEEN   Squamous Epithelial / HPF 0-5 0 - 5 /HPF   Mucus PRESENT    Ca Oxalate Crys, UA PRESENT   Urine Drug Screen, Qualitative ([facility] only)   Collection Time: 12/13/23  7:14 PM  Result Value Ref Range   Tricyclic, Ur Screen POSITIVE (A) NONE DETECTED   Amphetamines, Ur Screen NONE DETECTED NONE DETECTED   MDMA (Ecstasy)Ur Screen NONE DETECTED NONE DETECTED   Cocaine Metabolite,Ur [place] NONE DETECTED NONE DETECTED   Opiate, Ur Screen NONE DETECTED NONE DETECTED   Phencyclidine (PCP) Ur S NONE DETECTED NONE DETECTED   Cannabinoid 50 Ng, Ur [place] NONE DETECTED NONE DETECTED   Barbiturates, Ur Screen NONE DETECTED NONE DETECTED   Benzodiazepine, Ur Scrn NONE DETECTED NONE DETECTED   Methadone Scn, Ur NONE DETECTED NONE DETECTED    No results found.  Constitutional: NAD, AAOx3  PULM: nl respiratory effort Abd: gravid, non-tender Ext: Non-tender,  Nonedmeatous Psych: mood appropriate, speech normal SVE: Dilation: Closed Effacement (%): 50 Cervical Position: Posterior Station: -2 Presentation: Vertex Exam by:: A. Nikki Rusnak CNM  -No change after 12 hours   NST: Baseline FHR: 125 beats/min Variability: moderate Accelerations: present Decelerations: absent Tocometry: None Time: at least 20 minutes   Interpretation: Category I INDICATIONS: rule out uterine contractions RESULTS:  A NST procedure was performed with FHR monitoring and a normal baseline established, appropriate time of 20-40 minutes of evaluation, and accels >2 seen w 15x15 characteristics.  Results show a REACTIVE NST.   Consults: None  Procedures: NST  Hospital Course: The patient was admitted to Labor and Delivery Triage for observation. She was observed overnight due to reports of contractions and positive FFN.  Discussed that while a negative FFN is reassuring it does not have a high positive predictive value for  positive results. The presence of bacterial vaginosis can also interfere with the accuracy of the FFN result. Her cervical exam is reassuring and unchanged after 12 hours. Contractions have stopped. Prescription sent to pharmacy for Flagyl . Instructed to keep scheduled prenatal appointment for next week. Return to L&D for 4-6 or more contractions in an hours, leakage of fluid, vaginal bleeding, or decreased fetal movement.  She was deemed stable for discharge and further outpatient management.   Discharge Condition: stable  Disposition: Discharge disposition: 01-Home or Self Care       Allergies as of 12/14/2023       Reactions   Penicillins Hives   Amoxicillin    Prenatal Vitamins Rash   Not allergic to Flinstones gummy vitamins        Medication List     TAKE these medications    famotidine  20 MG tablet Commonly known as: PEPCID  Take 1 tablet (20 mg total) by mouth 2 (two) times daily.   FLINTSTONES COMPLETE PO Take by mouth.    metroNIDAZOLE  500 MG tablet Commonly known as: FLAGYL  Take 1 tablet (500 mg total) by mouth every 12 (twelve) hours for 6 days.   sertraline 50 MG tablet Commonly known as: ZOLOFT Take 50 mg by mouth daily.        Follow-up Information     [facility] OB[facility]. Go in 1 week(s).   Why: routine prenatal appointment               ----- Therisa CHRISTELLA Pillow, CNM  Certified Nurse Midwife [place]  [facility]

## 2023-12-19 ENCOUNTER — Encounter: Payer: Self-pay | Admitting: Obstetrics and Gynecology

## 2023-12-19 ENCOUNTER — Observation Stay: Admission: EM | Admit: 2023-12-19 | Discharge: 2023-12-20 | Disposition: A | Discharge status: Home / Self Care

## 2023-12-19 DIAGNOSIS — O4703 False labor before 37 completed weeks of gestation, third trimester: Principal | ICD-10-CM | POA: Insufficient documentation

## 2023-12-19 DIAGNOSIS — O4700 False labor before 37 completed weeks of gestation, unspecified trimester: Principal | ICD-10-CM | POA: Diagnosis present

## 2023-12-19 DIAGNOSIS — O99333 Smoking (tobacco) complicating pregnancy, third trimester: Secondary | ICD-10-CM | POA: Insufficient documentation

## 2023-12-19 DIAGNOSIS — Z3A32 32 weeks gestation of pregnancy: Secondary | ICD-10-CM | POA: Insufficient documentation

## 2023-12-19 DIAGNOSIS — O0001 Abdominal pregnancy with intrauterine pregnancy: Secondary | ICD-10-CM | POA: Insufficient documentation

## 2023-12-19 DIAGNOSIS — F17210 Nicotine dependence, cigarettes, uncomplicated: Secondary | ICD-10-CM | POA: Insufficient documentation

## 2023-12-19 NOTE — OB Triage Note (Signed)
 Danielle Leon 23 y.o. @G5P3  @32w2dGA   presents to Labor & Delivery triage via wheelchair steered by ED staff reporting contraction off and on over the past couple of weeks but states that they got painful today around 11am. Patient was seen at [facility] for contractions earlier today. . She denies signs and symptoms consistent with rupture of membranes or active vaginal bleeding. She denies  states positive fetal movement. External FM and TOCO applied to non-tender abdomen. Initial FHR 155. Vital signs obtained and within normal limits. Patient oriented to care environment including call bell and bed control use. Therisa Pillow, CNM notified of patient's arrival.

## 2023-12-20 DIAGNOSIS — O4703 False labor before 37 completed weeks of gestation, third trimester: Secondary | ICD-10-CM | POA: Diagnosis present

## 2023-12-20 DIAGNOSIS — Z3A32 32 weeks gestation of pregnancy: Secondary | ICD-10-CM | POA: Diagnosis not present

## 2023-12-20 DIAGNOSIS — O0001 Abdominal pregnancy with intrauterine pregnancy: Secondary | ICD-10-CM | POA: Diagnosis not present

## 2023-12-20 DIAGNOSIS — O99333 Smoking (tobacco) complicating pregnancy, third trimester: Secondary | ICD-10-CM | POA: Diagnosis not present

## 2023-12-20 DIAGNOSIS — F17210 Nicotine dependence, cigarettes, uncomplicated: Secondary | ICD-10-CM | POA: Diagnosis not present

## 2023-12-20 LAB — URINALYSIS, ROUTINE W REFLEX MICROSCOPIC
Bilirubin Urine: NEGATIVE
Glucose, UA: NEGATIVE mg/dL
Hgb urine dipstick: NEGATIVE
Ketones, ur: NEGATIVE mg/dL
Nitrite: NEGATIVE
Protein, ur: NEGATIVE mg/dL
Specific Gravity, Urine: 1.019 (ref 1.005–1.030)
pH: 5 (ref 5.0–8.0)

## 2023-12-20 MED ORDER — ACETAMINOPHEN 500 MG PO TABS: 1000.0000 mg | ORAL_TABLET | Freq: Four times a day (QID) | ORAL | Status: AC | PRN

## 2023-12-20 MED ORDER — CALCIUM CARBONATE ANTACID 500 MG PO CHEW: 2.0000 | CHEWABLE_TABLET | ORAL | Status: DC | PRN

## 2023-12-20 MED ORDER — ACETAMINOPHEN 500 MG PO TABS: 1000.0000 mg | ORAL_TABLET | Freq: Four times a day (QID) | ORAL | Status: DC | PRN

## 2023-12-20 NOTE — Discharge Instructions (Signed)
 PRETERM LABOR: Includes any of the following symptoms that occur between 20-[redacted] weeks gestation. If these symptoms are not stopped, preterm labor can result in preterm delivery, placing your baby at risk.  Notify your doctor if any of the following occur: 1. Menstrual-like cramps   5. Pelvic pressure  2. Uterine contractions. These may be painless and feel like the uterus is tightening or the baby is balling up 6. Increase or change in vaginal discharge  3. Low, dull backache, unrelieved by heat or Tylenol   7. Vaginal bleeding  4. Intestinal cramps, with our without diarrhea, sometimes 8. A general feeling that something is not right   9. Leaking of fluid described as gas pain

## 2023-12-20 NOTE — Discharge Summary (Signed)
 Danielle Leon is a 23 y.o. female. She is at [redacted]w[redacted]d gestation. Patient's last menstrual period was 02/14/2023 (approximate). 02/11/2024, by Other Basis   Prenatal care site: [facility]  Chief complaint: contractions   Admission Diagnoses:  1) intrauterine pregnancy at [redacted]w[redacted]d  2) Preterm contractions [O47.00]  Discharge Diagnoses:  Principal Problem:   Preterm contractions  HPI: Danielle Leon presents to L&D with complaints of contractions. She was evaluated in triage at [facility]. Reports cervical exam at that time was 1cm and she was discharged home. Contractions have continued and she reports pelvic pain and pressure.  Her pregnancy is complicated by obesity, previous cesarean delivery d/t breech presentation, history of preterm labor at 36 weeks, history of third degree perineal laceration, depression, and history of genital HSV-1. She denies Loss of fluid or Vaginal bleeding. Endorses fetal movement as active.   S: Resting comfortably. no CTX, no VB.no LOF,  Active fetal movement.   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    Past Surgical Hx:  has a past surgical history that includes Tympanostomy tube placement (age 29); Cesarean section (09/14/2021); and Cholecystectomy (06/11/2023).   Allergies  Allergen Reactions   Penicillins Hives   Amoxicillin    Prenatal Vitamins Rash    Not allergic to Flinstones gummy vitamins     Prior to Admission medications   Medication Sig Start Date End Date Taking? Authorizing Provider  metroNIDAZOLE  (FLAGYL ) 500 MG tablet Take 1 tablet (500 mg total) by mouth every 12 (twelve) hours for 6 days. 12/14/23 12/20/23 Yes Vernel Therisa HERO, CNM  Pediatric Multivitamins-Iron (FLINTSTONES COMPLETE PO) Take by mouth.   Yes [provider]  sertraline (ZOLOFT) 50 MG tablet Take 50 mg by mouth daily.   Yes  [provider]  acetaminophen  (TYLENOL ) 500 MG tablet Take 2 tablets (1,000 mg total) by mouth every 6 (six) hours as needed (for pain scale < 4  OR  temperature  >/=  100.5 F). 12/20/23   Vernel Therisa HERO, CNM  famotidine  (PEPCID ) 20 MG tablet Take 1 tablet (20 mg total) by mouth 2 (two) times daily. 02/05/23 03/07/23  Viviann Pastor, MD    Social History: She  reports that she has been smoking cigarettes. She started smoking about 4 years ago. She has a 0.5 pack-year smoking history. She has been exposed to tobacco smoke. She has never used smokeless tobacco. She reports current alcohol use of about 1.0 standard drink of alcohol per week. She reports that she does not currently use drugs.  Family History: family history includes Anemia in her mother; Autism in her brother and brother; Healthy in her maternal grandmother and son; Heart attack in her mother; Hypertension in her father; Intellectual disability in her brother; Macrocephaly in her brother; Stroke in her maternal grandfather and mother.   Review of Systems: A full review of systems was performed and negative except as noted in the HPI.     Pertinent Results:   O:  BP 110/72 (BP Location: Left Arm)   Pulse (!) 110   Temp 98.3 F (36.8 C) (Oral)   Resp 18   LMP 02/14/2023 (Approximate)  Results for orders placed or performed during the hospital encounter of 12/19/23 (from the past 48 hours)  Urinalysis, Routine w reflex microscopic -Urine, Clean Catch   Collection Time: 12/20/23 12:11 AM  Result Value Ref Range   Color,  Urine YELLOW (A) YELLOW   APPearance HAZY (A) CLEAR   Specific Gravity, Urine 1.019 1.005 - 1.030   pH 5.0 5.0 - 8.0   Glucose, UA NEGATIVE NEGATIVE mg/dL   Hgb urine dipstick NEGATIVE NEGATIVE   Bilirubin Urine NEGATIVE NEGATIVE   Ketones, ur NEGATIVE NEGATIVE mg/dL   Protein, ur NEGATIVE NEGATIVE mg/dL   Nitrite NEGATIVE NEGATIVE   Leukocytes,Ua TRACE (A) NEGATIVE   RBC / HPF 0-5 0 - 5  RBC/hpf   WBC, UA 0-5 0 - 5 WBC/hpf   Bacteria, UA RARE (A) NONE SEEN   Squamous Epithelial / HPF 6-10 0 - 5 /HPF   Mucus PRESENT     No results found.  Constitutional: NAD, AAOx3  PULM: nl respiratory effort Abd: gravid, non-tender Ext: Non-tender, Nonedmeatous Psych: mood appropriate, speech normal SVE: Dilation: 1 Effacement (%): 40 Cervical Position: Posterior Station: -2 Presentation: Vertex Exam by:: A Lafonda Patron CNM  - Unchanged from exam at [facility] > 6 hours   NST: Baseline FHR: 145 beats/min Variability: moderate Accelerations: present Decelerations: absent Tocometry: Occasional mild contraction  Time: at least 20 minutes   Interpretation: Category I INDICATIONS: rule out uterine contractions RESULTS:  A NST procedure was performed with FHR monitoring and a normal baseline established, appropriate time of 20-40 minutes of evaluation, and accels >2 seen w 15x15 characteristics.  Results show a REACTIVE NST.   Consults: None  Procedures: NST  Hospital Course: The patient was admitted to Labor and Delivery Triage for observation. She was monitored for over 2 hours. Contractions were infrequent and cervical exam was unchanged from exam at [facility] > 6 hours prior. Wet prep was also negative at [facility]. Urinalysis was unremarkable. A urine culture was added for bacteruria screening d/t her symptoms. Reviewed comfort measures and warning signs to return to L&D with. Instructed to keep scheduled appointment for today.  She was deemed stable for discharge and further outpatient management.   Discharge Condition: stable  Disposition: Discharge disposition: 01-Home or Self Care       Allergies as of 12/20/2023       Reactions   Penicillins Hives   Amoxicillin    Prenatal Vitamins Rash   Not allergic to Flinstones gummy vitamins        Medication List     TAKE these medications    acetaminophen  500 MG tablet Commonly known as: TYLENOL  Take 2 tablets (1,000 mg total) by  mouth every 6 (six) hours as needed (for pain scale < 4  OR  temperature  >/=  100.5 F).   famotidine  20 MG tablet Commonly known as: PEPCID  Take 1 tablet (20 mg total) by mouth 2 (two) times daily.   FLINTSTONES COMPLETE PO Take by mouth.   metroNIDAZOLE  500 MG tablet Commonly known as: FLAGYL  Take 1 tablet (500 mg total) by mouth every 12 (twelve) hours for 6 days.   sertraline 50 MG tablet Commonly known as: ZOLOFT Take 50 mg by mouth daily.        Follow-up Information     [facility]. Go to.   Why: today for scheduled prenatal appointment               ----- Therisa CHRISTELLA Pillow, CNM  Certified Nurse Midwife [facility]

## 2023-12-21 LAB — URINE CULTURE

## 2024-01-02 ENCOUNTER — Observation Stay

## 2024-01-02 ENCOUNTER — Observation Stay
Admission: EM | Admit: 2024-01-02 | Discharge: 2024-01-02 | Disposition: A | Discharge status: Home / Self Care | Attending: Obstetrics and Gynecology | Admitting: Obstetrics and Gynecology

## 2024-01-02 ENCOUNTER — Other Ambulatory Visit: Payer: Self-pay

## 2024-01-02 ENCOUNTER — Encounter: Payer: Self-pay | Admitting: Obstetrics and Gynecology

## 2024-01-02 DIAGNOSIS — O09893 Supervision of other high risk pregnancies, third trimester: Secondary | ICD-10-CM

## 2024-01-02 DIAGNOSIS — B9689 Other specified bacterial agents as the cause of diseases classified elsewhere: Secondary | ICD-10-CM

## 2024-01-02 DIAGNOSIS — O99213 Obesity complicating pregnancy, third trimester: Secondary | ICD-10-CM | POA: Insufficient documentation

## 2024-01-02 DIAGNOSIS — E669 Obesity, unspecified: Secondary | ICD-10-CM | POA: Insufficient documentation

## 2024-01-02 DIAGNOSIS — O2643 Herpes gestationis, third trimester: Secondary | ICD-10-CM | POA: Insufficient documentation

## 2024-01-02 DIAGNOSIS — O26893 Other specified pregnancy related conditions, third trimester: Secondary | ICD-10-CM | POA: Diagnosis present

## 2024-01-02 DIAGNOSIS — Z8751 Personal history of pre-term labor: Secondary | ICD-10-CM | POA: Diagnosis not present

## 2024-01-02 DIAGNOSIS — Z3A34 34 weeks gestation of pregnancy: Secondary | ICD-10-CM | POA: Diagnosis not present

## 2024-01-02 DIAGNOSIS — F32A Depression, unspecified: Secondary | ICD-10-CM | POA: Diagnosis not present

## 2024-01-02 DIAGNOSIS — R102 Pelvic and perineal pain unspecified side: Secondary | ICD-10-CM | POA: Diagnosis not present

## 2024-01-02 DIAGNOSIS — O4700 False labor before 37 completed weeks of gestation, unspecified trimester: Secondary | ICD-10-CM | POA: Diagnosis present

## 2024-01-02 DIAGNOSIS — R8789 Other abnormal findings in specimens from female genital organs: Principal | ICD-10-CM

## 2024-01-02 DIAGNOSIS — O99343 Other mental disorders complicating pregnancy, third trimester: Secondary | ICD-10-CM | POA: Insufficient documentation

## 2024-01-02 DIAGNOSIS — M545 Low back pain, unspecified: Secondary | ICD-10-CM | POA: Diagnosis not present

## 2024-01-02 LAB — FETAL FIBRONECTIN: Fetal Fibronectin: NEGATIVE

## 2024-01-02 LAB — URINALYSIS, COMPLETE (UACMP) WITH MICROSCOPIC
Bilirubin Urine: NEGATIVE
Glucose, UA: NEGATIVE mg/dL
Ketones, ur: NEGATIVE mg/dL
Nitrite: NEGATIVE
Protein, ur: 100 mg/dL — AB
Specific Gravity, Urine: 1.016 (ref 1.005–1.030)
WBC, UA: >50 WBC/hpf (ref 0–5)
pH: 6 (ref 5.0–8.0)

## 2024-01-02 LAB — WET PREP, GENITAL
Sperm: NONE SEEN
Trich, Wet Prep: NONE SEEN
WBC, Wet Prep HPF POC: <10 (ref <10)
Yeast Wet Prep HPF POC: NONE SEEN

## 2024-01-02 MED ORDER — METRONIDAZOLE 500 MG PO TABS: 500.0000 mg | ORAL_TABLET | Freq: Two times a day (BID) | ORAL | 0 refills | Status: DC

## 2024-01-02 MED ORDER — METRONIDAZOLE 500 MG PO TABS: 500.0000 mg | ORAL_TABLET | Freq: Two times a day (BID) | ORAL | Status: DC

## 2024-01-02 NOTE — Discharge Summary (Signed)
 Patient ID: Danielle Leon MRN: [id number] DOB/AGE: 2000/10/18 23 y.o.  Admit date: 01/02/2024 Discharge date: 01/02/2024  Admission Diagnoses: 23yo G5P2 at [redacted]w[redacted]d presents with lower pelvic and lower back pain.  Danielle Leon last prenatal appointment at [facility] was 12/20/23 and Danielle Leon was 1/50/-3.  Danielle Leon was also seen the 12/19/23 at [facility] triage where Danielle Leon was 1/30/high.  Danielle Leon was positive for BV and treated 12/13/23.  Ultrasound was unremarkable.  Danielle Leon was observed, fetal heart rate monitoring remained reassuring, and Danielle Leon had no signs/symptoms of progressing preterm labor or other maternal-fetal concerns.  Danielle Leon was deemed stable for discharge to home with outpatient follow up.  Discharge Diagnoses: No cervical change  Factors complicating pregnancy: HSV Previous c/s Obesity H/o PTL Depression  Prenatal Procedures: NST and ultrasound  Consults: None  Significant Diagnostic Studies:  Results for orders placed or performed during the hospital encounter of 01/02/24 (from the past week)  Wet prep, genital   Collection Time: 01/02/24  6:56 PM  Result Value Ref Range   Yeast Wet Prep HPF POC NONE SEEN NONE SEEN   Trich, Wet Prep NONE SEEN NONE SEEN   Clue Cells Wet Prep HPF POC PRESENT (A) NONE SEEN   WBC, Wet Prep HPF POC <10 <10   Sperm NONE SEEN   Urinalysis, Complete w Microscopic -Urine, Clean Catch   Collection Time: 01/02/24  6:56 PM  Result Value Ref Range   Color, Urine YELLOW (A) YELLOW   APPearance CLOUDY (A) CLEAR   Specific Gravity, Urine 1.016 1.005 - 1.030   pH 6.0 5.0 - 8.0   Glucose, UA NEGATIVE NEGATIVE mg/dL   Hgb urine dipstick SMALL (A) NEGATIVE   Bilirubin Urine NEGATIVE NEGATIVE   Ketones, ur NEGATIVE NEGATIVE mg/dL   Protein, ur 899 (A) NEGATIVE mg/dL   Nitrite NEGATIVE NEGATIVE   Leukocytes,Ua LARGE (A) NEGATIVE   RBC / HPF 11-20 0 - 5 RBC/hpf   WBC, UA >50 0 - 5 WBC/hpf   Bacteria, UA RARE (A) NONE SEEN   Squamous Epithelial / HPF 0-5 0 - 5 /HPF   WBC Clumps PRESENT     Mucus PRESENT   Fetal fibronectin   Collection Time: 01/02/24  6:56 PM  Result Value Ref Range   Fetal Fibronectin NEGATIVE NEGATIVE    US  OB Limited Result Date: 01/02/2024 CLINICAL DATA:  Pelvic pain in advanced pregnancy, initial encounter EXAM: LIMITED OBSTETRIC ULTRASOUND AND TRANSVAGINAL OBSTETRIC ULTRASOUND FINDINGS: Number of Fetuses: 1 Heart Rate:  136 bpm Movement: Present Presentation: Cephalic Placental Location: Anterior Previa: Absent Amniotic Fluid (Subjective): Within normal limits. AFI: 10.2 cm FL:  6.5cm 33w 4d MATERNAL FINDINGS: Cervix:  Appears closed. Uterus/Adnexae:  No abnormality visualized. IMPRESSION: Single live intrauterine gestation at 33 weeks 4 days. This exam is performed on an emergent basis and does not comprehensively evaluate fetal size, dating, or anatomy; follow-up complete OB US  should be considered if further fetal assessment is warranted. Electronically Signed   By: [name] M.D.   On: 01/02/2024 20:48   US  OB Transvaginal Result Date: 01/02/2024 CLINICAL DATA:  Pelvic pain in advanced pregnancy, initial encounter EXAM: LIMITED OBSTETRIC ULTRASOUND AND TRANSVAGINAL OBSTETRIC ULTRASOUND FINDINGS: Number of Fetuses: 1 Heart Rate:  136 bpm Movement: Present Presentation: Cephalic Placental Location: Anterior Previa: Absent Amniotic Fluid (Subjective): Within normal limits. AFI: 10.2 cm FL:  6.5cm 33w 4d MATERNAL FINDINGS: Cervix:  Appears closed. Uterus/Adnexae:  No abnormality visualized. IMPRESSION: Single live intrauterine gestation at 33 weeks 4 days. This exam is performed  on an emergent basis and does not comprehensively evaluate fetal size, dating, or anatomy; follow-up complete OB US  should be considered if further fetal assessment is warranted. Electronically Signed   By: [name] M.D.   On: 01/02/2024 20:48     Treatments: none  Hospital Course:  This is a 23 y.o. [id number] with IUP at [redacted]w[redacted]d seen for lower pelvic and lower back pain, noted  to have a cervical exam of 0.5/Thick/High.  Speculum exam showed thick white discharge.  No leaking of fluid and no bleeding.  An fFN, wet prep and UA were collected. Danielle Leon was positive for BV,      An ultrasound showed    Danielle Leon was observed, fetal heart rate monitoring remained reassuring, and Danielle Leon had no signs/symptoms of progressing preterm labor or other maternal-fetal concerns.  Danielle Leon cervical exam was unchanged from admission.  Danielle Leon was deemed stable for discharge to home with outpatient follow up.  Discharge Physical Exam:  BP 107/76 (BP Location: Left Arm)   Pulse 100   Temp 98.1 F (36.7 C) (Oral)   Resp 18   Ht 5' 6 (1.676 m)   Wt 89.4 kg   LMP 02/14/2023 (Approximate)   BMI 31.80 kg/m   General: NAD CV: RRR Pulm: nl effort ABD: s/nd/nt, gravid DVT Evaluation: LE non-ttp, no evidence of DVT on exam.  NST: FHR baseline: 170 bpm Variability: moderate Accelerations: yes Decelerations: none Category/reactivity: reactive   TOCO: occasional SVE:  Dilation: Fingertip Effacement (%): Thick Cervical Position: Posterior Station: Ballotable Exam by:: JINNY Coe, CNM   Discharge Condition: Stable  Disposition:  Discharge disposition: 01-Home or Self Care        Allergies as of 01/02/2024       Reactions   Penicillins Hives   Amoxicillin    Prenatal Vitamins Rash   Not allergic to Flinstones gummy vitamins        Medication List     TAKE these medications    acetaminophen  500 MG tablet Commonly known as: TYLENOL  Take 2 tablets (1,000 mg total) by mouth every 6 (six) hours as needed (for pain scale < 4  OR  temperature  >/=  100.5 F).   famotidine  20 MG tablet Commonly known as: PEPCID  Take 1 tablet (20 mg total) by mouth 2 (two) times daily.   FLINTSTONES COMPLETE PO Take by mouth.   metroNIDAZOLE  500 MG tablet Commonly known as: FLAGYL  Take 1 tablet (500 mg total) by mouth 2 (two) times daily for 7 days.   sertraline 50 MG tablet Commonly known  as: ZOLOFT Take 50 mg by mouth daily.        Follow-up Information     [facility]. Schedule an appointment as soon as possible for a visit in 2 day(s).                  SignedBETHA DELON COE, CNM 01/02/2024 9:06 PM

## 2024-01-02 NOTE — OB Triage Note (Signed)
 Pt reports to labor and delivery with complaints of contractions that started this morning. States that she lost a lot of mucus yesterday and the contraction intensity has increased from that time. Not sure how far apart her contractions are.   Last intercourse was 2 days ago. Denies vaginal itching and irritation. Denies dysuria and urgency.   Denies vaginal bleeding and LOF. States positive fetal movement.   EFM and toco applied and assessing.

## 2024-01-05 ENCOUNTER — Emergency Department

## 2024-01-05 ENCOUNTER — Encounter: Payer: Self-pay | Admitting: Emergency Medicine

## 2024-01-05 ENCOUNTER — Other Ambulatory Visit: Payer: Self-pay

## 2024-01-05 ENCOUNTER — Inpatient Hospital Stay
Admission: EM | Admit: 2024-01-05 | Discharge: 2024-01-09 | DRG: 831 | Disposition: A | Discharge status: Home / Self Care | Attending: Emergency Medicine | Admitting: Emergency Medicine

## 2024-01-05 DIAGNOSIS — Z8249 Family history of ischemic heart disease and other diseases of the circulatory system: Secondary | ICD-10-CM

## 2024-01-05 DIAGNOSIS — E876 Hypokalemia: Secondary | ICD-10-CM | POA: Diagnosis present

## 2024-01-05 DIAGNOSIS — R079 Chest pain, unspecified: Secondary | ICD-10-CM

## 2024-01-05 DIAGNOSIS — A4151 Sepsis due to Escherichia coli [E. coli]: Secondary | ICD-10-CM | POA: Diagnosis present

## 2024-01-05 DIAGNOSIS — O23593 Infection of other part of genital tract in pregnancy, third trimester: Secondary | ICD-10-CM | POA: Diagnosis present

## 2024-01-05 DIAGNOSIS — Z79899 Other long term (current) drug therapy: Secondary | ICD-10-CM | POA: Diagnosis not present

## 2024-01-05 DIAGNOSIS — Z3A34 34 weeks gestation of pregnancy: Secondary | ICD-10-CM | POA: Diagnosis not present

## 2024-01-05 DIAGNOSIS — O99333 Smoking (tobacco) complicating pregnancy, third trimester: Secondary | ICD-10-CM | POA: Diagnosis present

## 2024-01-05 DIAGNOSIS — O98313 Other infections with a predominantly sexual mode of transmission complicating pregnancy, third trimester: Secondary | ICD-10-CM | POA: Diagnosis present

## 2024-01-05 DIAGNOSIS — O99283 Endocrine, nutritional and metabolic diseases complicating pregnancy, third trimester: Secondary | ICD-10-CM | POA: Diagnosis present

## 2024-01-05 DIAGNOSIS — Z1152 Encounter for screening for COVID-19: Secondary | ICD-10-CM

## 2024-01-05 DIAGNOSIS — B9620 Unspecified Escherichia coli [E. coli] as the cause of diseases classified elsewhere: Secondary | ICD-10-CM | POA: Diagnosis not present

## 2024-01-05 DIAGNOSIS — Z880 Allergy status to penicillin: Secondary | ICD-10-CM | POA: Diagnosis not present

## 2024-01-05 DIAGNOSIS — R7881 Bacteremia: Secondary | ICD-10-CM

## 2024-01-05 DIAGNOSIS — O212 Late vomiting of pregnancy: Secondary | ICD-10-CM | POA: Diagnosis present

## 2024-01-05 DIAGNOSIS — Z87440 Personal history of urinary (tract) infections: Secondary | ICD-10-CM

## 2024-01-05 DIAGNOSIS — A6000 Herpesviral infection of urogenital system, unspecified: Secondary | ICD-10-CM | POA: Diagnosis present

## 2024-01-05 DIAGNOSIS — F17210 Nicotine dependence, cigarettes, uncomplicated: Secondary | ICD-10-CM | POA: Diagnosis present

## 2024-01-05 DIAGNOSIS — O2303 Infections of kidney in pregnancy, third trimester: Secondary | ICD-10-CM | POA: Diagnosis present

## 2024-01-05 DIAGNOSIS — Z3A35 35 weeks gestation of pregnancy: Secondary | ICD-10-CM

## 2024-01-05 DIAGNOSIS — O98813 Other maternal infectious and parasitic diseases complicating pregnancy, third trimester: Secondary | ICD-10-CM | POA: Diagnosis present

## 2024-01-05 DIAGNOSIS — O34211 Maternal care for low transverse scar from previous cesarean delivery: Secondary | ICD-10-CM | POA: Diagnosis present

## 2024-01-05 DIAGNOSIS — R000 Tachycardia, unspecified: Principal | ICD-10-CM

## 2024-01-05 LAB — COMPREHENSIVE METABOLIC PANEL WITH GFR
ALT: 20 U/L (ref 0–44)
AST: 27 U/L (ref 15–41)
Albumin: 3.1 g/dL — ABNORMAL LOW (ref 3.5–5.0)
Alkaline Phosphatase: 192 U/L — ABNORMAL HIGH (ref 38–126)
Anion gap: 17 — ABNORMAL HIGH (ref 5–15)
BUN: 8 mg/dL (ref 6–20)
CO2: 17 mmol/L — ABNORMAL LOW (ref 22–32)
Calcium: 9 mg/dL (ref 8.9–10.3)
Chloride: 99 mmol/L (ref 98–111)
Creatinine, Ser: 0.85 mg/dL (ref 0.44–1.00)
GFR, Estimated: >60 mL/min (ref >60)
Glucose, Bld: 88 mg/dL (ref 70–99)
Potassium: 2.9 mmol/L — ABNORMAL LOW (ref 3.5–5.1)
Sodium: 133 mmol/L — ABNORMAL LOW (ref 135–145)
Total Bilirubin: 1.5 mg/dL — ABNORMAL HIGH (ref 0.0–1.2)
Total Protein: 6.7 g/dL (ref 6.5–8.1)

## 2024-01-05 LAB — URINALYSIS, ROUTINE W REFLEX MICROSCOPIC
Bilirubin Urine: NEGATIVE
Glucose, UA: NEGATIVE mg/dL
Hgb urine dipstick: NEGATIVE
Ketones, ur: 20 mg/dL — AB
Nitrite: NEGATIVE
Protein, ur: 100 mg/dL — AB
Specific Gravity, Urine: 1.017 (ref 1.005–1.030)
WBC, UA: >50 WBC/hpf (ref 0–5)
pH: 5 (ref 5.0–8.0)

## 2024-01-05 LAB — CBC WITH DIFFERENTIAL/PLATELET
Abs Immature Granulocytes: 2.48 K/uL — ABNORMAL HIGH (ref 0.00–0.07)
Basophils Absolute: 0.1 K/uL (ref 0.0–0.1)
Basophils Relative: 0 %
Eosinophils Absolute: 0.1 K/uL (ref 0.0–0.5)
Eosinophils Relative: 0 %
HCT: 31.7 % — ABNORMAL LOW (ref 36.0–46.0)
Hemoglobin: 10.7 g/dL — ABNORMAL LOW (ref 12.0–15.0)
Immature Granulocytes: 6 %
Lymphocytes Relative: 4 %
Lymphs Abs: 1.5 K/uL (ref 0.7–4.0)
MCH: 28.6 pg (ref 26.0–34.0)
MCHC: 33.8 g/dL (ref 30.0–36.0)
MCV: 84.8 fL (ref 80.0–100.0)
Monocytes Absolute: 4.2 K/uL — ABNORMAL HIGH (ref 0.1–1.0)
Monocytes Relative: 11 %
Neutro Abs: 30.2 K/uL — ABNORMAL HIGH (ref 1.7–7.7)
Neutrophils Relative %: 79 %
Platelets: 280 K/uL (ref 150–400)
RBC: 3.74 MIL/uL — ABNORMAL LOW (ref 3.87–5.11)
RDW: 12.7 % (ref 11.5–15.5)
WBC: 38.6 K/uL — ABNORMAL HIGH (ref 4.0–10.5)
nRBC: 0.1 % (ref 0.0–0.2)

## 2024-01-05 LAB — TROPONIN T, HIGH SENSITIVITY
Troponin T High Sensitivity: <15 ng/L (ref 0–19)
Troponin T High Sensitivity: <15 ng/L (ref 0–19)

## 2024-01-05 LAB — PRO BRAIN NATRIURETIC PEPTIDE: Pro Brain Natriuretic Peptide: 274 pg/mL (ref <300.0)

## 2024-01-05 LAB — LIPASE, BLOOD: Lipase: <10 U/L — ABNORMAL LOW (ref 11–51)

## 2024-01-05 LAB — RESP PANEL BY RT-PCR (RSV, FLU A&B, COVID)  RVPGX2
Influenza A by PCR: NEGATIVE
Influenza B by PCR: NEGATIVE
Resp Syncytial Virus by PCR: NEGATIVE
SARS Coronavirus 2 by RT PCR: NEGATIVE

## 2024-01-05 LAB — LACTIC ACID, PLASMA
Lactic Acid, Venous: 1.2 mmol/L (ref 0.5–1.9)
Lactic Acid, Venous: 1.1 mmol/L (ref 0.5–1.9)

## 2024-01-05 LAB — PROCALCITONIN: Procalcitonin: 22.6 ng/mL

## 2024-01-05 LAB — MAGNESIUM: Magnesium: 1.3 mg/dL — ABNORMAL LOW (ref 1.7–2.4)

## 2024-01-05 MED ORDER — ACETAMINOPHEN 325 MG PO TABS
650.0000 mg | ORAL_TABLET | ORAL | Status: DC | PRN
  Administered 2024-01-06 – 2024-01-07 (×5): 650 mg via ORAL
  Filled 2024-01-05 (×7): qty 2

## 2024-01-05 MED ORDER — SODIUM CHLORIDE 0.9 % IV BOLUS
1000.0000 mL | Freq: Once | INTRAVENOUS | Status: AC
  Administered 2024-01-05: 1000 mL via INTRAVENOUS

## 2024-01-05 MED ORDER — ACETAMINOPHEN 500 MG PO TABS
1000.0000 mg | ORAL_TABLET | Freq: Once | ORAL | Status: AC
  Administered 2024-01-05: 1000 mg via ORAL
  Filled 2024-01-05: qty 2

## 2024-01-05 MED ORDER — CALCIUM CARBONATE ANTACID 500 MG PO CHEW
2.0000 | CHEWABLE_TABLET | ORAL | Status: DC | PRN
  Administered 2024-01-05 – 2024-01-09 (×11): 400 mg via ORAL
  Filled 2024-01-05 (×11): qty 2

## 2024-01-05 MED ORDER — SODIUM CHLORIDE 0.9 % IV SOLN
2.0000 g | INTRAVENOUS | Status: DC
  Administered 2024-01-05 – 2024-01-07 (×3): 2 g via INTRAVENOUS
  Filled 2024-01-05 (×4): qty 20

## 2024-01-05 MED ORDER — ONDANSETRON HCL 4 MG/2ML IJ SOLN
4.0000 mg | Freq: Once | INTRAMUSCULAR | Status: AC
  Administered 2024-01-05: 4 mg via INTRAVENOUS
  Filled 2024-01-05: qty 2

## 2024-01-05 MED ORDER — DIPHENHYDRAMINE HCL 50 MG/ML IJ SOLN
25.0000 mg | Freq: Once | INTRAMUSCULAR | Status: AC
  Administered 2024-01-05: 25 mg via INTRAVENOUS
  Filled 2024-01-05: qty 1

## 2024-01-05 MED ORDER — POTASSIUM CHLORIDE 10 MEQ/100ML IV SOLN
10.0000 meq | INTRAVENOUS | Status: AC
  Administered 2024-01-05 (×3): 10 meq via INTRAVENOUS
  Filled 2024-01-05: qty 100

## 2024-01-05 MED ORDER — MAGNESIUM SULFATE 4 GM/100ML IV SOLN
4.0000 g | Freq: Once | INTRAVENOUS | Status: AC
  Administered 2024-01-06: 4 g via INTRAVENOUS
  Filled 2024-01-05: qty 100

## 2024-01-05 MED ORDER — LACTATED RINGERS IV SOLN: INTRAVENOUS | Status: AC

## 2024-01-05 MED ORDER — DOCUSATE SODIUM 100 MG PO CAPS
100.0000 mg | ORAL_CAPSULE | Freq: Every day | ORAL | Status: DC
  Administered 2024-01-06 – 2024-01-09 (×4): 100 mg via ORAL
  Filled 2024-01-05 (×4): qty 1

## 2024-01-05 MED ORDER — METRONIDAZOLE 500 MG/100ML IV SOLN
500.0000 mg | Freq: Once | INTRAVENOUS | Status: AC
  Administered 2024-01-05: 500 mg via INTRAVENOUS
  Filled 2024-01-05: qty 100

## 2024-01-05 MED ORDER — SODIUM CHLORIDE 0.9 % IV SOLN: INTRAVENOUS | Status: AC | PRN

## 2024-01-05 MED ORDER — LACTATED RINGERS IV BOLUS
1000.0000 mL | Freq: Once | INTRAVENOUS | Status: AC
  Administered 2024-01-05: 1000 mL via INTRAVENOUS

## 2024-01-05 MED ORDER — SODIUM CHLORIDE 0.9 % IV SOLN
2.0000 g | Freq: Once | INTRAVENOUS | Status: AC
  Administered 2024-01-05: 2 g via INTRAVENOUS
  Filled 2024-01-05: qty 12.5

## 2024-01-05 MED ORDER — ZOLPIDEM TARTRATE 5 MG PO TABS: 5.0000 mg | ORAL_TABLET | Freq: Every evening | ORAL | Status: DC | PRN

## 2024-01-05 MED ORDER — ONDANSETRON 4 MG PO TBDP: 4.0000 mg | ORAL_TABLET | Freq: Four times a day (QID) | ORAL | Status: DC | PRN

## 2024-01-05 MED ORDER — PRENATAL MULTIVITAMIN CH
1.0000 | ORAL_TABLET | Freq: Every day | ORAL | Status: DC
  Filled 2024-01-05 (×2): qty 1

## 2024-01-05 MED ORDER — ONDANSETRON HCL 4 MG/2ML IJ SOLN
4.0000 mg | Freq: Four times a day (QID) | INTRAMUSCULAR | Status: DC | PRN
  Administered 2024-01-05 – 2024-01-07 (×5): 4 mg via INTRAVENOUS
  Filled 2024-01-05 (×5): qty 2

## 2024-01-05 MED ORDER — METOCLOPRAMIDE HCL 5 MG/ML IJ SOLN
10.0000 mg | Freq: Once | INTRAMUSCULAR | Status: AC
  Administered 2024-01-05: 10 mg via INTRAVENOUS
  Filled 2024-01-05: qty 2

## 2024-01-05 MED ORDER — METRONIDAZOLE 0.75 % VA GEL
1.0000 | Freq: Every day | VAGINAL | Status: DC
  Administered 2024-01-06 – 2024-01-08 (×4): 1 via VAGINAL
  Filled 2024-01-05 (×2): qty 70

## 2024-01-05 NOTE — ED Notes (Signed)
 This tech answered pt call light. Pt assisted to toilet. Pt urinated at this time. Midwife at bedside.

## 2024-01-05 NOTE — Consult Note (Signed)
 Danielle Leon is a 23 y.o. female. She is at [redacted]w[redacted]d gestation. Patient's last menstrual period was 02/14/2023 (approximate). Estimated Date of Delivery: 02/11/24  Prenatal care site: [facility], unassigned  Current pregnancy complicated by:  - obesity - previous cesarean section - preterm contractions - cholecystectomy in pregnancy - depression - history of preterm delivery @ 36wks with previous baby - HSV  Chief complaint: called down to the ER to evaluate patient and rule-out preterm labor. Patient came to the ER for vomiting and chest pain. She was reporting the pain was coming and going and started in her upper abdomen and radiated into her chest. She is frequently vomiting and reports she has been unable to keep any food/fluids down. She was recently seen in L&D for preterm contractions with a negative FFN and a wet prep that showed BV. She was prescribed Metronidazole  for her BV but has been unable to take any of her treatment due to her vomiting. She denies any contractions. She denies any abdominal pain. She reports good fetal movement.   S: Resting uncomfortably, moaning and restless. no CTX, no VB.no LOF,  Active fetal movement.  Denies: HA, visual changes, SOB  Maternal Medical History:   Past Medical History:  Diagnosis Date   Acute cystitis 05/2020   Depression 10/11/2020   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   Migraine     Past Surgical History:  Procedure Laterality Date   CESAREAN SECTION  09/14/2021   CHOLECYSTECTOMY  06/11/2023   TYMPANOSTOMY TUBE PLACEMENT  age 21    Allergies  Allergen Reactions   Penicillin G    Penicillins Hives   Amoxicillin    Prenatal Vit-Fe Fumarate-Fa Dermatitis    Not allergic to Flinstones gummy vitamins   Prenatal Vitamins Rash    Not allergic to Flinstones gummy vitamins    Prior to Admission medications   Medication Sig Start Date End Date Taking? Authorizing Provider  acetaminophen  (TYLENOL ) 500 MG tablet  Take 2 tablets (1,000 mg total) by mouth every 6 (six) hours as needed (for pain scale < 4  OR  temperature  >/=  100.5 F). 12/20/23   Danielle Leon, CNM  famotidine  (PEPCID ) 20 MG tablet Take 1 tablet (20 mg total) by mouth 2 (two) times daily. 02/05/23 03/07/23  Danielle Pastor, MD  metroNIDAZOLE  (FLAGYL ) 500 MG tablet Take 1 tablet (500 mg total) by mouth 2 (two) times daily for 7 days. 01/02/24 01/09/24  Danielle Leon, CNM  Pediatric Multivitamins-Iron (FLINTSTONES COMPLETE PO) Take by mouth.    [provider]  sertraline (ZOLOFT) 50 MG tablet Take 50 mg by mouth daily.    [provider]    Social History: She  reports that she has been smoking cigarettes. She started smoking about 4 years ago. She has a 0.5 pack-year smoking history. She has been exposed to tobacco smoke. She has never used smokeless tobacco. She reports current alcohol use of about 1.0 standard drink of alcohol per week. She reports that she does not currently use drugs.  Family History: family history includes Anemia in her mother; Autism in her brother and brother; Healthy in her maternal grandmother and son; Heart attack in her mother; Hypertension in her father; Intellectual disability in her brother; Macrocephaly in her brother; Stroke in her maternal grandfather and mother.  no history of gyn cancers  Review of Systems: A full review of systems was performed and negative except as noted in the HPI.     O:  BP 104/66   Pulse (!) 111   Temp 99 F (37.2 C) (Oral)   Resp (!) 38   Ht 5' 6 (1.676 m)   Wt 90 kg   LMP 02/14/2023 (Approximate)   SpO2 100%   BMI 32.02 kg/m  Results for orders placed or performed during the hospital encounter of 01/05/24 (from the past 48 hours)  Resp panel by RT-PCR (RSV, Flu A&B, Covid) Anterior Nasal Swab   Collection Time: 01/05/24  2:46 PM   Specimen: Anterior Nasal Swab  Result Value Ref Range   SARS Coronavirus 2 by RT PCR NEGATIVE NEGATIVE   Influenza  A by PCR NEGATIVE NEGATIVE   Influenza B by PCR NEGATIVE NEGATIVE   Resp Syncytial Virus by PCR NEGATIVE NEGATIVE  CBC with Differential   Collection Time: 01/05/24  2:46 PM  Result Value Ref Range   WBC 38.6 (H) 4.0 - 10.5 K/uL   RBC 3.74 (L) 3.87 - 5.11 MIL/uL   Hemoglobin 10.7 (L) 12.0 - 15.0 g/dL   HCT 68.2 (L) 63.9 - 53.9 %   MCV 84.8 80.0 - 100.0 fL   MCH 28.6 26.0 - 34.0 pg   MCHC 33.8 30.0 - 36.0 g/dL   RDW 87.2 88.4 - 84.4 %   Platelets 280 150 - 400 K/uL   nRBC 0.1 0.0 - 0.2 %   Neutrophils Relative % 79 %   Neutro Abs 30.2 (H) 1.7 - 7.7 K/uL   Lymphocytes Relative 4 %   Lymphs Abs 1.5 0.7 - 4.0 K/uL   Monocytes Relative 11 %   Monocytes Absolute 4.2 (H) 0.1 - 1.0 K/uL   Eosinophils Relative 0 %   Eosinophils Absolute 0.1 0.0 - 0.5 K/uL   Basophils Relative 0 %   Basophils Absolute 0.1 0.0 - 0.1 K/uL   Immature Granulocytes 6 %   Abs Immature Granulocytes 2.48 (H) 0.00 - 0.07 K/uL  Comprehensive metabolic panel   Collection Time: 01/05/24  2:46 PM  Result Value Ref Range   Sodium 133 (L) 135 - 145 mmol/L   Potassium 2.9 (L) 3.5 - 5.1 mmol/L   Chloride 99 98 - 111 mmol/L   CO2 17 (L) 22 - 32 mmol/L   Glucose, Bld 88 70 - 99 mg/dL   BUN 8 6 - 20 mg/dL   Creatinine, Ser 9.14 0.44 - 1.00 mg/dL   Calcium 9.0 8.9 - 89.6 mg/dL   Total Protein 6.7 6.5 - 8.1 g/dL   Albumin 3.1 (L) 3.5 - 5.0 g/dL   AST 27 15 - 41 U/L   ALT 20 0 - 44 U/L   Alkaline Phosphatase 192 (H) 38 - 126 U/L   Total Bilirubin 1.5 (H) 0.0 - 1.2 mg/dL   GFR, Estimated >39 >39 mL/min   Anion gap 17 (H) 5 - 15  Lipase, blood   Collection Time: 01/05/24  2:46 PM  Result Value Ref Range   Lipase <10 (L) 11 - 51 U/L  Pro Brain natriuretic peptide   Collection Time: 01/05/24  2:46 PM  Result Value Ref Range   Pro Brain Natriuretic Peptide 274.0 <300.0 pg/mL  Magnesium   Collection Time: 01/05/24  2:46 PM  Result Value Ref Range   Magnesium 1.3 (L) 1.7 - 2.4 mg/dL  Procalcitonin   Collection  Time: 01/05/24  2:46 PM  Result Value Ref Range   Procalcitonin 22.60 ng/mL  Troponin T, High Sensitivity   Collection Time: 01/05/24  2:46 PM  Result Value Ref Range  Troponin T High Sensitivity <15 0 - 19 ng/L  Lactic acid, plasma   Collection Time: 01/05/24  4:25 PM  Result Value Ref Range   Lactic Acid, Venous 1.2 0.5 - 1.9 mmol/L  Troponin T, High Sensitivity   Collection Time: 01/05/24  4:25 PM  Result Value Ref Range   Troponin T High Sensitivity <15 0 - 19 ng/L     Constitutional: NAD, AAOx3  HE/ENT: extraocular movements grossly intact, moist mucous membranes CV: RRR PULM: nl respiratory effort, CTABL     Abd: gravid, non-tender, non-distended, soft      Ext: Non-tender, Nonedematous   Psych: mood appropriate, speech normal Pelvic: SSE done, cervix 1/50/-2, posterior, medium consistency, Copious amounts of thick white vaginal discharge present.  Pelvic exam: normal external genitalia, vulva, vagina, cervix, uterus and adnexa.  Fetal  monitoring: Cat 1 Appropriate for GA Baseline: 140bpm Variability: moderate Accelerations: present x >2 Decelerations absent Contractions: rare, 2 contractions on NST Time  Obstetric consult in the ER for chest pain and r/o preterm labor  Principle Diagnosis:  High risk pregnancy in third trimester  Labor: not present. Cervix has previously been documented as 0.5-1cm over the last several weeks without significant change. Her abdomen is soft and non-tender. NST reactive with only two contractions present. FFN collected 01/02/24 negative which is reassuring x 7 days.  BV: wet prep collected 01/02/24 positive for BV. She has been unable to take her Metronidazole  d/t vomiting. Metrogel  0.75% vaginally x 5 nights ordered. Fetal Wellbeing: Reassuring Cat 1 tracing. Reactive NST.  If patient is admitted, she will need daily NST. If patient is not admitted, she can come to L&D for a NST prior to discharge.  Thank you for involving us   in this patient's care.   Edsel Charlies Blush, CNM 01/05/2024 5:38 PM

## 2024-01-05 NOTE — ED Notes (Signed)
 Attempted report no waiting to get a call back to give report on this patient.

## 2024-01-05 NOTE — ED Provider Notes (Signed)
 Patient received in signout from Dr. Ernest.  Multiparous patient at about 35 weeks presents with recurrent emesis, chest discomfort ultimately with signs of sepsis from pyelonephritis.   She is tachycardic, develops fever but no shock or instability.  Significant leukocytosis and elevated procalcitonin but normal lactic acid.  Urine is infectious and sent for culture.  Due to marginal elevation of alkaline phosphatase and bilirubin I obtain MRCP without contrast to assess for choledocholithiasis or cholangitis considering her presenting epigastric discomfort and emesis.  This study is reassuring with regards to hepatobiliary pathology but does demonstrate evidence of left-sided pyelonephritis.  Her troponins remain low, no dysrhythmias and I doubt PE.  More likely MSK chest discomfort in the setting of retching and emesis from a urinary pathology.  She does acknowledge dysuria, frequency, hesitancy that she had initially attributed to just being third-trimester pregnant.  I discussed with [facility]N [facility] midwife Edsel Blush who agrees to admit to third floor mother-baby for further monitoring and management.  .Critical Care  Performed by: Claudene Rover, MD Authorized by: Claudene Rover, MD   Critical care provider statement:    Critical care time (minutes):  30   Critical care time was exclusive of:  Separately billable procedures and treating other patients   Critical care was necessary to treat or prevent imminent or life-threatening deterioration of the following conditions:  Sepsis   Critical care was time spent personally by me on the following activities:  Development of treatment plan with patient or surrogate, discussions with consultants, evaluation of patient's response to treatment, examination of patient, ordering and review of laboratory studies, ordering and review of radiographic studies, ordering and performing treatments and interventions, pulse oximetry, re-evaluation of  patient's condition and review of old charts     Claudene Rover, MD 01/05/24 2013

## 2024-01-05 NOTE — ED Notes (Signed)
 Provider informed the patient has a fever of 101.9 oral,

## 2024-01-05 NOTE — ED Notes (Signed)
 Patient returned from MRI.

## 2024-01-05 NOTE — ED Notes (Signed)
 Patient has a ready room

## 2024-01-05 NOTE — Progress Notes (Signed)
 PHARMACY CONSULT NOTE  Pharmacy Consult for Electrolyte Monitoring and Replacement   Recent Labs: Potassium (mmol/L)  Date Value  01/05/2024 2.9 (L)   Magnesium (mg/dL)  Date Value  88/84/7974 1.3 (L)   Calcium (mg/dL)  Date Value  88/84/7974 9.0   Albumin (g/dL)  Date Value  88/84/7974 3.1 (L)   Sodium (mmol/L)  Date Value  01/05/2024 133 (L)    Assessment: Pt is 23 yo pregnant female admitted for pyelonephritis affecting pregnancy in third trimester.  Pharmacy consulted to assist with electrolyte monitoring and replacement as indicated.  Goal of Therapy:  Electrolytes within normal limits  Plan:  Pt ordered KCl 10 mEq IV x 3 runs by ED provider Ordered Mag 4 gm IV once. Recheck Electrolytes w/ AM labs --Pharmacy will continue to follow along  Rankin CANDIE Dills, PharmD, [facility] 01/05/2024 10:23 PM
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
## 2024-01-05 NOTE — ED Provider Notes (Signed)
 [facility] Provider Note    Event Date/Time   First MD Initiated Contact with Patient 01/05/24 1440     (approximate)   History   Emesis (PT reports one day h/o nausea vomiting; she is [redacted] weeks pregnant G4/P3/AB1 and also having tightening of the area from her umbilicus upwards that comes and goes every few minutes)   HPI  Danielle Leon is a 23 y.o. female G4, P3 currently 34 weeks 5 days who comes in with concerns for 1 day of nausea, vomiting as well as chest pain.  She also reports some pain in her upper abdomen that feels a tightening sensation that comes and goes every few minutes.  She denies any gush of fluid or vaginal bleeding.  She reports having her gallbladder removed during this pregnancy but denies any other issues.  She denies any alcohol or drug use.  She denies any urinary symptoms.  Does report some shortness of breath associated with this as well.   Physical Exam   Triage Vital Signs: ED Triage Vitals  Encounter Vitals Group     BP 01/05/24 1438 101/67     Girls Systolic BP Percentile --      Girls Diastolic BP Percentile --      Boys Systolic BP Percentile --      Boys Diastolic BP Percentile --      Pulse Rate 01/05/24 1438 (!) 127     Resp 01/05/24 1438 (!) 22     Temp 01/05/24 1438 99 F (37.2 C)     Temp Source 01/05/24 1438 Oral     SpO2 01/05/24 1438 100 %     Weight 01/05/24 1440 198 lb 6.6 oz (90 kg)     Height --      Head Circumference --      Peak Flow --      Pain Score --      Pain Loc --      Pain Education --      Exclude from Growth Chart --     Most recent vital signs: Vitals:   01/05/24 1438  BP: 101/67  Pulse: (!) 127  Resp: (!) 22  Temp: 99 F (37.2 C)  SpO2: 100%     General: Awake, no distress.  CV:  Good peripheral perfusion.  Tachycardic Resp:  Normal effort.  Abd:  No distention.  She reports some upper abdominal tightening but without any rebound, guarding Other:  No swelling in  legs.  No calf tenderness Patient cries out in pain when a cardiac sticker lead is taken off. Cries out in pain with tourniquet placement   ED Results / Procedures / Treatments   Labs (all labs ordered are listed, but only abnormal results are displayed) Labs Reviewed  CBC WITH DIFFERENTIAL/PLATELET  COMPREHENSIVE METABOLIC PANEL WITH GFR  LIPASE, BLOOD  PRO BRAIN NATRIURETIC PEPTIDE  URINALYSIS, ROUTINE W REFLEX MICROSCOPIC  TROPONIN T, HIGH SENSITIVITY     EKG  My interpretation of EKG:  Sinus tachycardia rate of 140 without any ST elevation or T wave versions maybe slightly in lead III, normal intervals  RADIOLOGY I have reviewed the xray personally and interpreted no PNA    PROCEDURES:  Critical Care performed: No  .1-3 Lead EKG Interpretation  Performed by: Ernest Ronal BRAVO, MD Authorized by: Ernest Ronal BRAVO, MD     Interpretation: abnormal     ECG rate:  120   ECG rate assessment: tachycardic  Rhythm: sinus tachycardia     Ectopy: none     Conduction: normal      MEDICATIONS ORDERED IN ED: Medications  sodium chloride 0.9 % bolus 1,000 mL (has no administration in time range)  ondansetron  (ZOFRAN ) injection 4 mg (has no administration in time range)     IMPRESSION / MDM / ASSESSMENT AND PLAN / ED COURSE  I reviewed the triage vital signs and the nursing notes.   Patient's presentation is most consistent with acute presentation with potential threat to life or bodily function.   Patient comes in with chest pain.  Her EKG without evidence of STEMI she is tachycardic but she is also complaining of tightening in her upper abdomen that makes me concerned about the possibility of contractions, active labor and given she would be a preemie I have called the OB/GYN team and they are going to come down and evaluate her.  Will get labs to evaluate for ACS, CHF.  She already has had her gallbladder removed so we will get labs to make sure no evidence of  choledocholithiasis.  Patient be given some fluids, Zofran .  Will continue to closely monitor patient.  Patient will be handed off to oncoming team pending OB's evaluation and blood work.  Given her nausea and vomiting she could be just dehydrated from a viral bug will test for COVID, flu.  Chest x-ray to make sure no pneumonia.  This time PE seems less likely but may need additional workup for this if workup is otherwise unrevealing and she remains tachycardic.  Patient be handed off to oncoming team pending repeat evaluation and discussion with OB/GYN   FHT present-- Pt not HTN unlikely pre eclampsia.   The patient is on the cardiac monitor to evaluate for evidence of arrhythmia and/or significant heart rate changes.      FINAL CLINICAL IMPRESSION(S) / ED DIAGNOSES   Final diagnoses:  Tachycardia  Chest pain, unspecified type     Rx / DC Orders   ED Discharge Orders     None        Note:  This document was prepared using Dragon voice recognition software and may include unintentional dictation errors.   Ernest Ronal BRAVO, MD 01/05/24 [phone]

## 2024-01-06 ENCOUNTER — Inpatient Hospital Stay

## 2024-01-06 DIAGNOSIS — Z3A34 34 weeks gestation of pregnancy: Secondary | ICD-10-CM

## 2024-01-06 LAB — BLOOD CULTURE ID PANEL (REFLEXED) - BCID2

## 2024-01-06 LAB — BASIC METABOLIC PANEL WITH GFR
Anion gap: 17 — ABNORMAL HIGH (ref 5–15)
BUN: 9 mg/dL (ref 6–20)
CO2: 17 mmol/L — ABNORMAL LOW (ref 22–32)
Calcium: 9.3 mg/dL (ref 8.9–10.3)
Chloride: 102 mmol/L (ref 98–111)
Creatinine, Ser: 0.74 mg/dL (ref 0.44–1.00)
GFR, Estimated: >60 mL/min (ref >60)
Glucose, Bld: 89 mg/dL (ref 70–99)
Potassium: 3.5 mmol/L (ref 3.5–5.1)
Sodium: 135 mmol/L (ref 135–145)

## 2024-01-06 LAB — CBC
HCT: 32.2 % — ABNORMAL LOW (ref 36.0–46.0)
Hemoglobin: 10.5 g/dL — ABNORMAL LOW (ref 12.0–15.0)
MCH: 28.8 pg (ref 26.0–34.0)
MCHC: 32.6 g/dL (ref 30.0–36.0)
MCV: 88.5 fL (ref 80.0–100.0)
Platelets: 251 K/uL (ref 150–400)
RBC: 3.64 MIL/uL — ABNORMAL LOW (ref 3.87–5.11)
RDW: 13 % (ref 11.5–15.5)
WBC: 31.5 K/uL — ABNORMAL HIGH (ref 4.0–10.5)
nRBC: 0.1 % (ref 0.0–0.2)

## 2024-01-06 LAB — LACTIC ACID, PLASMA
Lactic Acid, Venous: 1 mmol/L (ref 0.5–1.9)
Lactic Acid, Venous: 1 mmol/L (ref 0.5–1.9)

## 2024-01-06 LAB — MAGNESIUM: Magnesium: 2.5 mg/dL — ABNORMAL HIGH (ref 1.7–2.4)

## 2024-01-06 LAB — PHOSPHORUS: Phosphorus: 2.6 mg/dL (ref 2.5–4.6)

## 2024-01-06 MED ORDER — ALUM & MAG HYDROXIDE-SIMETH 200-200-20 MG/5ML PO SUSP
30.0000 mL | Freq: Once | ORAL | Status: AC
  Administered 2024-01-06: 30 mL via ORAL
  Filled 2024-01-06: qty 30

## 2024-01-06 MED ORDER — SODIUM CHLORIDE 0.9 % IV SOLN
25.0000 mg | Freq: Four times a day (QID) | INTRAVENOUS | Status: DC | PRN
  Administered 2024-01-06 (×4): 25 mg via INTRAVENOUS
  Filled 2024-01-06 (×2): qty 1
  Filled 2024-01-06: qty 25
  Filled 2024-01-06 (×2): qty 1

## 2024-01-06 MED ORDER — LACTATED RINGERS IV BOLUS
500.0000 mL | Freq: Once | INTRAVENOUS | Status: AC
  Administered 2024-01-06: 500 mL via INTRAVENOUS

## 2024-01-06 MED ORDER — SODIUM CHLORIDE 0.9 % IV SOLN: 25.0000 mg | Freq: Four times a day (QID) | INTRAVENOUS | Status: DC | PRN

## 2024-01-06 MED ORDER — ALUM & MAG HYDROXIDE-SIMETH 200-200-20 MG/5ML PO SUSP
30.0000 mL | ORAL | Status: DC | PRN
  Administered 2024-01-06 – 2024-01-08 (×10): 30 mL via ORAL
  Filled 2024-01-06 (×14): qty 30

## 2024-01-06 MED ORDER — POTASSIUM CHLORIDE CRYS ER 20 MEQ PO TBCR
40.0000 meq | EXTENDED_RELEASE_TABLET | Freq: Once | ORAL | Status: AC
  Administered 2024-01-06: 40 meq via ORAL
  Filled 2024-01-06: qty 2

## 2024-01-06 NOTE — Progress Notes (Signed)
 Patient ID: Danielle Leon, female   DOB: 05-09-2000, 23 y.o.   MRN: [id number] + blood culture   On rocephin  . Will consult Hospitalist for duration of abx for + blood culture . Growth u/s Limited total fluid intake <3000 cc/ hr . NSt daily

## 2024-01-06 NOTE — Progress Notes (Signed)
 PHARMACY - PHYSICIAN COMMUNICATION CRITICAL VALUE ALERT - BLOOD CULTURE IDENTIFICATION (BCID)  Results for orders placed or performed during the hospital encounter of 01/05/24  Resp panel by RT-PCR (RSV, Flu A&B, Covid) Anterior Nasal Swab     Status: None   Collection Time: 01/05/24  2:46 PM   Specimen: Anterior Nasal Swab  Result Value Ref Range Status   SARS Coronavirus 2 by RT PCR NEGATIVE NEGATIVE Final    Comment: (NOTE) SARS-CoV-2 target nucleic acids are NOT DETECTED.  The SARS-CoV-2 RNA is generally detectable in upper respiratory specimens during the acute phase of infection. The lowest concentration of SARS-CoV-2 viral copies this assay can detect is 138 copies/mL. A negative result does not preclude SARS-Cov-2 infection and should not be used as the sole basis for treatment or other patient management decisions. A negative result may occur with  improper specimen collection/handling, submission of specimen other than nasopharyngeal swab, presence of viral mutation(s) within the areas targeted by this assay, and inadequate number of viral copies(<138 copies/mL). A negative result must be combined with clinical observations, patient history, and epidemiological information. The expected result is Negative.  Fact Sheet for Patients:  bloggercourse.com  Fact Sheet for Healthcare Providers:  seriousbroker.it  This test is no t yet approved or cleared by the [place]  FDA and  has been authorized for detection and/or diagnosis of SARS-CoV-2 by FDA under an Emergency Use Authorization (EUA). This EUA will remain  in effect (meaning this test can be used) for the duration of the COVID-19 declaration under Section 564(b)(1) of the Act, 21 U.S.C.section 360bbb-3(b)(1), unless the authorization is terminated  or revoked sooner.       Influenza A by PCR NEGATIVE NEGATIVE Final   Influenza B by PCR NEGATIVE NEGATIVE Final     Comment: (NOTE) The Xpert Xpress SARS-CoV-2/FLU/RSV plus assay is intended as an aid in the diagnosis of influenza from Nasopharyngeal swab specimens and should not be used as a sole basis for treatment. Nasal washings and aspirates are unacceptable for Xpert Xpress SARS-CoV-2/FLU/RSV testing.  Fact Sheet for Patients: bloggercourse.com  Fact Sheet for Healthcare Providers: seriousbroker.it  This test is not yet approved or cleared by the [place]  FDA and has been authorized for detection and/or diagnosis of SARS-CoV-2 by FDA under an Emergency Use Authorization (EUA). This EUA will remain in effect (meaning this test can be used) for the duration of the COVID-19 declaration under Section 564(b)(1) of the Act, 21 U.S.C. section 360bbb-3(b)(1), unless the authorization is terminated or revoked.     Resp Syncytial Virus by PCR NEGATIVE NEGATIVE Final    Comment: (NOTE) Fact Sheet for Patients: bloggercourse.com  Fact Sheet for Healthcare Providers: seriousbroker.it  This test is not yet approved or cleared by the [place]  FDA and has been authorized for detection and/or diagnosis of SARS-CoV-2 by FDA under an Emergency Use Authorization (EUA). This EUA will remain in effect (meaning this test can be used) for the duration of the COVID-19 declaration under Section 564(b)(1) of the Act, 21 U.S.C. section 360bbb-3(b)(1), unless the authorization is terminated or revoked.  Performed at [facility], [address] [place], [place] [address]   Blood culture (routine x 2)     Status: None (Preliminary result)   Collection Time: 01/05/24  4:25 PM   Specimen: BLOOD  Result Value Ref Range Status   Specimen Description BLOOD LEFT ANTECUBITAL  Final   Special Requests   Final    BOTTLES DRAWN AEROBIC AND ANAEROBIC  Blood Culture adequate volume   Culture  Setup  Time   Final    Organism ID to follow ANAEROBIC BOTTLE ONLY GRAM NEGATIVE RODS CRITICAL RESULT CALLED TO, READ BACK BY AND VERIFIED WITH: RANKIN WENDI GOWER D AT 9381 01/06/24 RAM Performed at [facility], [address] [place], [place] [address]    Culture GRAM NEGATIVE RODS  Final   Report Status PENDING  Incomplete  Blood Culture ID Panel (Reflexed)     Status: Abnormal   Collection Time: 01/05/24  4:25 PM  Result Value Ref Range Status   Enterococcus faecalis NOT DETECTED NOT DETECTED Final   Enterococcus Faecium NOT DETECTED NOT DETECTED Final   Listeria monocytogenes NOT DETECTED NOT DETECTED Final   Staphylococcus species NOT DETECTED NOT DETECTED Final   Staphylococcus aureus (BCID) NOT DETECTED NOT DETECTED Final   Staphylococcus epidermidis NOT DETECTED NOT DETECTED Final   Staphylococcus lugdunensis NOT DETECTED NOT DETECTED Final   Streptococcus species NOT DETECTED NOT DETECTED Final   Streptococcus agalactiae NOT DETECTED NOT DETECTED Final   Streptococcus pneumoniae NOT DETECTED NOT DETECTED Final   Streptococcus pyogenes NOT DETECTED NOT DETECTED Final   A.calcoaceticus-baumannii NOT DETECTED NOT DETECTED Final   Bacteroides fragilis NOT DETECTED NOT DETECTED Final   Enterobacterales DETECTED (A) NOT DETECTED Final    Comment: Enterobacterales represent a large order of gram negative bacteria, not a single organism. CRITICAL RESULT CALLED TO, READ BACK BY AND VERIFIED WITH: RANKIN WENDI GOWER D AT 9381 01/06/24 RAM    Enterobacter cloacae complex NOT DETECTED NOT DETECTED Final   Escherichia coli DETECTED (A) NOT DETECTED Final    Comment: CRITICAL RESULT CALLED TO, READ BACK BY AND VERIFIED WITH: RANKIN WENDI GOWER D AT 0618 01/06/24 RAM    Klebsiella aerogenes NOT DETECTED NOT DETECTED Final   Klebsiella oxytoca NOT DETECTED NOT DETECTED Final   Klebsiella pneumoniae NOT DETECTED NOT DETECTED Final   Proteus species NOT DETECTED NOT DETECTED Final    Salmonella species NOT DETECTED NOT DETECTED Final   Serratia marcescens NOT DETECTED NOT DETECTED Final   Haemophilus influenzae NOT DETECTED NOT DETECTED Final   Neisseria meningitidis NOT DETECTED NOT DETECTED Final   Pseudomonas aeruginosa NOT DETECTED NOT DETECTED Final   Stenotrophomonas maltophilia NOT DETECTED NOT DETECTED Final   Candida albicans NOT DETECTED NOT DETECTED Final   Candida auris NOT DETECTED NOT DETECTED Final   Candida glabrata NOT DETECTED NOT DETECTED Final   Candida krusei NOT DETECTED NOT DETECTED Final   Candida parapsilosis NOT DETECTED NOT DETECTED Final   Candida tropicalis NOT DETECTED NOT DETECTED Final   Cryptococcus neoformans/gattii NOT DETECTED NOT DETECTED Final   CTX-M ESBL NOT DETECTED NOT DETECTED Final   Carbapenem resistance IMP NOT DETECTED NOT DETECTED Final   Carbapenem resistance KPC NOT DETECTED NOT DETECTED Final   Carbapenem resistance NDM NOT DETECTED NOT DETECTED Final   Carbapenem resist OXA 48 LIKE NOT DETECTED NOT DETECTED Final   Carbapenem resistance VIM NOT DETECTED NOT DETECTED Final    Comment: Performed at [facility], [address] [place], [place] [address]    BCID Results: 1 (anaerobic) of 4 bottles w/ E. Coli, NR.  Pt currently on Ceftriaxone  2 gm q24h.  Name of provider contacted: CHARM Blush, CNM   Changes to prescribed antibiotics required: No changes at this time.  Rankin CANDIE Dills, PharmD, [facility] 01/06/2024 6:23 AM

## 2024-01-06 NOTE — Progress Notes (Signed)
 ANTEPARTUM PROGRESS NOTE  Danielle Leon is a 23 y.o. [id number] at [redacted]w[redacted]d who is admitted for pyelonephritis.   Estimated Date of Delivery: 02/11/24  Length of Stay:  1 Days. Admitted 01/05/2024  Subjective: She reports her chest pain is improved with taking Maalox. She is overall starting to feel better.   She reports:  -active fetal movement -no leakage of fluid -no vaginal bleeding -no contractions  Vitals:  BP 118/83 (BP Location: Left Arm)   Pulse (!) 107   Temp 98.5 F (36.9 C) (Oral)   Resp 18   Ht 5' 6 (1.676 m)   Wt 90 kg   LMP 02/14/2023 (Approximate)   SpO2 99%   BMI 32.02 kg/m  Physical Examination: General:   alert, cooperative, no distress, and pale  Skin:  normal  Neurologic:    Alert & oriented x 3  Lungs:   clear to auscultation bilaterally  Abdomen: gravid and non-tender  Pelvis:  Exam deferred.  Extremities: : non-tender, symmetric, mild edema bilaterally.  DTRs: +2    Presentation: cephalic  NST: Baseline FHR: 140 beats/min Variability: moderate Accelerations: present Decelerations: absent Tocometry: no contractions noted Time: 50 minutes  Interpretation: reactive NST INDICATIONS: high risk pregnancy, pyelonephritis RESULTS:  A NST procedure was performed with FHR monitoring and a normal baseline established, appropriate time of 20-40 minutes of evaluation, and accels >2 seen w 15x15 characteristics.  Results show a REACTIVE NST.    Results for orders placed or performed during the hospital encounter of 01/05/24 (from the past 48 hours)  CBC with Differential     Status: Abnormal   Collection Time: 01/05/24  2:46 PM  Result Value Ref Range   WBC 38.6 (H) 4.0 - 10.5 K/uL   RBC 3.74 (L) 3.87 - 5.11 MIL/uL   Hemoglobin 10.7 (L) 12.0 - 15.0 g/dL   HCT 68.2 (L) 63.9 - 53.9 %   MCV 84.8 80.0 - 100.0 fL   MCH 28.6 26.0 - 34.0 pg   MCHC 33.8 30.0 - 36.0 g/dL   RDW 87.2 88.4 - 84.4 %   Platelets 280 150 - 400 K/uL   nRBC 0.1 0.0 - 0.2 %    Neutrophils Relative % 79 %   Neutro Abs 30.2 (H) 1.7 - 7.7 K/uL   Lymphocytes Relative 4 %   Lymphs Abs 1.5 0.7 - 4.0 K/uL   Monocytes Relative 11 %   Monocytes Absolute 4.2 (H) 0.1 - 1.0 K/uL   Eosinophils Relative 0 %   Eosinophils Absolute 0.1 0.0 - 0.5 K/uL   Basophils Relative 0 %   Basophils Absolute 0.1 0.0 - 0.1 K/uL   Immature Granulocytes 6 %   Abs Immature Granulocytes 2.48 (H) 0.00 - 0.07 K/uL    Comment: Performed at [facility], [address] [place], [place] [address]  Comprehensive metabolic panel     Status: Abnormal   Collection Time: 01/05/24  2:46 PM  Result Value Ref Range   Sodium 133 (L) 135 - 145 mmol/L   Potassium 2.9 (L) 3.5 - 5.1 mmol/L   Chloride 99 98 - 111 mmol/L   CO2 17 (L) 22 - 32 mmol/L   Glucose, Bld 88 70 - 99 mg/dL    Comment: Glucose reference range applies only to samples taken after fasting for at least 8 hours.   BUN 8 6 - 20 mg/dL   Creatinine, Ser 9.14 0.44 - 1.00 mg/dL   Calcium 9.0 8.9 - 89.6 mg/dL   Total Protein  6.7 6.5 - 8.1 g/dL   Albumin 3.1 (L) 3.5 - 5.0 g/dL   AST 27 15 - 41 U/L   ALT 20 0 - 44 U/L   Alkaline Phosphatase 192 (H) 38 - 126 U/L   Total Bilirubin 1.5 (H) 0.0 - 1.2 mg/dL   GFR, Estimated >39 >39 mL/min    Comment: (NOTE) Calculated using the CKD-EPI Creatinine Equation (2021)    Anion gap 17 (H) 5 - 15    Comment: Performed at [facility], [address] [place], [place] [address]  Lipase, blood     Status: Abnormal   Collection Time: 01/05/24  2:46 PM  Result Value Ref Range   Lipase <10 (L) 11 - 51 U/L    Comment: Performed at [facility], [address] [place], [place] [address]  Troponin T, High Sensitivity     Status: None   Collection Time: 01/05/24  2:46 PM  Result Value Ref Range   Troponin T High Sensitivity <15 0 - 19 ng/L    Comment: (NOTE) Biotin concentrations > 1000 ng/mL falsely decrease TnT results.  Serial cardiac troponin measurements are  suggested.  Refer to the Links section for chest pain algorithms and additional  guidance. Performed at [facility], [address] [place], [place] [address]   Pro Brain natriuretic peptide     Status: None   Collection Time: 01/05/24  2:46 PM  Result Value Ref Range   Pro Brain Natriuretic Peptide 274.0 <300.0 pg/mL    Comment: (NOTE) Age Group        Cut-Points    Interpretation  < 50 years     450 pg/mL       NT-proBNP > 450 pg/mL indicates                                ADHF is likely              50 to 75 years  900 pg/mL      NT-proBNP > 900 pg/mL indicates          ADHF is likely  > 75 years      1800 pg/mL     NT-proBNP > 1800 pg/mL indicates          ADHF is likely                           All ages    Results between       Indeterminate. Further clinical             300 and the cut-   information is needed to determine            point for age group   if ADHF is present.                                                             Elecsys proBNP II/ Elecsys proBNP II STAT           Cut-Point                       Interpretation  300 pg/mL  NT-proBNP <300pg/mL indicates                             ADHF is not likely  Performed at [facility], [address] [place], [place] [address]   Resp panel by RT-PCR (RSV, Flu A&B, Covid) Anterior Nasal Swab     Status: None   Collection Time: 01/05/24  2:46 PM   Specimen: Anterior Nasal Swab  Result Value Ref Range   SARS Coronavirus 2 by RT PCR NEGATIVE NEGATIVE    Comment: (NOTE) SARS-CoV-2 target nucleic acids are NOT DETECTED.  The SARS-CoV-2 RNA is generally detectable in upper respiratory specimens during the acute phase of infection. The lowest concentration of SARS-CoV-2 viral copies this assay can detect is 138 copies/mL. A negative result does not preclude SARS-Cov-2 infection and should not be used as the sole basis for treatment or other patient management  decisions. A negative result may occur with  improper specimen collection/handling, submission of specimen other than nasopharyngeal swab, presence of viral mutation(s) within the areas targeted by this assay, and inadequate number of viral copies(<138 copies/mL). A negative result must be combined with clinical observations, patient history, and epidemiological information. The expected result is Negative.  Fact Sheet for Patients:  bloggercourse.com  Fact Sheet for Healthcare Providers:  seriousbroker.it  This test is no t yet approved or cleared by the [place]  FDA and  has been authorized for detection and/or diagnosis of SARS-CoV-2 by FDA under an Emergency Use Authorization (EUA). This EUA will remain  in effect (meaning this test can be used) for the duration of the COVID-19 declaration under Section 564(b)(1) of the Act, 21 U.S.C.section 360bbb-3(b)(1), unless the authorization is terminated  or revoked sooner.       Influenza A by PCR NEGATIVE NEGATIVE   Influenza B by PCR NEGATIVE NEGATIVE    Comment: (NOTE) The Xpert Xpress SARS-CoV-2/FLU/RSV plus assay is intended as an aid in the diagnosis of influenza from Nasopharyngeal swab specimens and should not be used as a sole basis for treatment. Nasal washings and aspirates are unacceptable for Xpert Xpress SARS-CoV-2/FLU/RSV testing.  Fact Sheet for Patients: bloggercourse.com  Fact Sheet for Healthcare Providers: seriousbroker.it  This test is not yet approved or cleared by the [place]  FDA and has been authorized for detection and/or diagnosis of SARS-CoV-2 by FDA under an Emergency Use Authorization (EUA). This EUA will remain in effect (meaning this test can be used) for the duration of the COVID-19 declaration under Section 564(b)(1) of the Act, 21 U.S.C. section 360bbb-3(b)(1), unless the authorization  is terminated or revoked.     Resp Syncytial Virus by PCR NEGATIVE NEGATIVE    Comment: (NOTE) Fact Sheet for Patients: bloggercourse.com  Fact Sheet for Healthcare Providers: seriousbroker.it  This test is not yet approved or cleared by the [place]  FDA and has been authorized for detection and/or diagnosis of SARS-CoV-2 by FDA under an Emergency Use Authorization (EUA). This EUA will remain in effect (meaning this test can be used) for the duration of the COVID-19 declaration under Section 564(b)(1) of the Act, 21 U.S.C. section 360bbb-3(b)(1), unless the authorization is terminated or revoked.  Performed at [facility], [address] [place], [place] [address]   Magnesium     Status: Abnormal   Collection Time: 01/05/24  2:46 PM  Result Value Ref Range   Magnesium 1.3 (L) 1.7 - 2.4 mg/dL    Comment: Performed at  [facility], [address] [place], [place] [address]  Procalcitonin     Status: None   Collection Time: 01/05/24  2:46 PM  Result Value Ref Range   Procalcitonin 22.60 ng/mL    Comment: (NOTE)   Sepsis PCT Algorithm          Lower Respiratory Tract Infection                                         PCT Algorithm -----------------------------------------------------------------  <0.5 ng/mL                    <0.10 ng/mL  Associated with low           Antibiotic therapy strongly   risk for progression          discouraged. Indicates absence   to severe sepsis              of bacteria infection  and/or septic shock             --------------------------------------------------------------  0.5-2.0 ng/mL                 0.10-0.25 ng/mL  Recommended to retest         Antibiotic therapy discouraged.  PCT within 6-24 hours         Bacterial infection unlikely  ------------------------------------------------------------  >2 ng/mL                      0.26-0.50 ng/mL  Associated with high  risk     Antibiotic therapy encouraged.  for progression to severe     Bacterial infection possible  sepsis/and or septic shock    ------------------------------                                 >0.50 ng/mL                                Antibiotic therapy strongly                                 encouraged.                                Suggestive of presence of                                 bacterial infection.                                 -------------------------------------------------------------------  < or = 0.50 ng/mL OR          < or = 0.25 OR 80% decrease in PCT  80% decrease in PCT           Antibiotic therapy   Antibiotic therapy may        may be discontinued  be discontinued  Performed at [facility], [address] [place], [place] [address]   Blood culture (routine x 2)     Status: None (Preliminary result)   Collection Time: 01/05/24  4:25 PM   Specimen: BLOOD  Result Value Ref Range   Specimen Description BLOOD RIGHT ANTECUBITAL    Special Requests      BOTTLES DRAWN AEROBIC AND ANAEROBIC Blood Culture adequate volume   Culture      NO GROWTH < 24 HOURS Performed at [facility], [address] [place], [place] [address]    Report Status PENDING   Blood culture (routine x 2)     Status: None (Preliminary result)   Collection Time: 01/05/24  4:25 PM   Specimen: BLOOD  Result Value Ref Range   Specimen Description      BLOOD LEFT ANTECUBITAL Performed at [facility], [address] [place], [place] [address]    Special Requests      BOTTLES DRAWN AEROBIC AND ANAEROBIC Blood Culture adequate volume Performed at [facility], [address] [place], [place] [address]    Culture  Setup Time      ANAEROBIC BOTTLE ONLY GRAM NEGATIVE RODS CRITICAL RESULT CALLED TO, READ BACK BY AND VERIFIED WITH: NATHAN B., PHARM D AT 9381 01/06/24 RAM Performed at [facility], [address] [place], [place] [address]    Culture GRAM NEGATIVE RODS    Report Status PENDING   Lactic acid, plasma     Status: None   Collection Time: 01/05/24  4:25 PM  Result Value Ref Range   Lactic Acid, Venous 1.2 0.5 - 1.9 mmol/L    Comment: Performed at [facility], [address] [place], [place] [address]  Troponin T, High Sensitivity     Status: None   Collection Time: 01/05/24  4:25 PM  Result Value Ref Range   Troponin T High Sensitivity <15 0 - 19 ng/L    Comment: (NOTE) Biotin concentrations > 1000 ng/mL falsely decrease TnT results.  Serial cardiac troponin measurements are suggested.  Refer to the Links section for chest pain algorithms and additional  guidance. Performed at [facility], [address] [place], [place] [address]   Blood Culture ID Panel (Reflexed)     Status: Abnormal   Collection Time: 01/05/24  4:25 PM  Result Value Ref Range   Enterococcus faecalis NOT DETECTED NOT DETECTED   Enterococcus Faecium NOT DETECTED NOT DETECTED   Listeria monocytogenes NOT DETECTED NOT DETECTED   Staphylococcus species NOT DETECTED NOT DETECTED   Staphylococcus aureus (BCID) NOT DETECTED NOT DETECTED   Staphylococcus epidermidis NOT DETECTED NOT DETECTED   Staphylococcus lugdunensis NOT DETECTED NOT DETECTED   Streptococcus species NOT DETECTED NOT DETECTED   Streptococcus agalactiae NOT DETECTED NOT DETECTED   Streptococcus pneumoniae NOT DETECTED NOT DETECTED   Streptococcus pyogenes NOT DETECTED NOT DETECTED   A.calcoaceticus-baumannii NOT DETECTED NOT DETECTED   Bacteroides fragilis NOT DETECTED NOT DETECTED   Enterobacterales DETECTED (A) NOT DETECTED    Comment: Enterobacterales represent a large order of gram negative bacteria, not a single organism. CRITICAL RESULT CALLED TO, READ BACK BY AND VERIFIED WITH: NATHAN B., PHARM D AT 9381 01/06/24 RAM    Enterobacter cloacae complex NOT DETECTED NOT DETECTED   Escherichia coli DETECTED  (A) NOT DETECTED    Comment: CRITICAL RESULT CALLED TO, READ BACK BY AND VERIFIED WITH: NATHAN B., PHARM D AT 9381 01/06/24 RAM    Klebsiella  aerogenes NOT DETECTED NOT DETECTED   Klebsiella oxytoca NOT DETECTED NOT DETECTED   Klebsiella pneumoniae NOT DETECTED NOT DETECTED   Proteus species NOT DETECTED NOT DETECTED   Salmonella species NOT DETECTED NOT DETECTED   Serratia marcescens NOT DETECTED NOT DETECTED   Haemophilus influenzae NOT DETECTED NOT DETECTED   Neisseria meningitidis NOT DETECTED NOT DETECTED   Pseudomonas aeruginosa NOT DETECTED NOT DETECTED   Stenotrophomonas maltophilia NOT DETECTED NOT DETECTED   Candida albicans NOT DETECTED NOT DETECTED   Candida auris NOT DETECTED NOT DETECTED   Candida glabrata NOT DETECTED NOT DETECTED   Candida krusei NOT DETECTED NOT DETECTED   Candida parapsilosis NOT DETECTED NOT DETECTED   Candida tropicalis NOT DETECTED NOT DETECTED   Cryptococcus neoformans/gattii NOT DETECTED NOT DETECTED   CTX-M ESBL NOT DETECTED NOT DETECTED   Carbapenem resistance IMP NOT DETECTED NOT DETECTED   Carbapenem resistance KPC NOT DETECTED NOT DETECTED   Carbapenem resistance NDM NOT DETECTED NOT DETECTED   Carbapenem resist OXA 48 LIKE NOT DETECTED NOT DETECTED   Carbapenem resistance VIM NOT DETECTED NOT DETECTED    Comment: Performed at [facility], [address] [place], [place] [address]  Urinalysis, Routine w reflex microscopic -Urine, Clean Catch     Status: Abnormal   Collection Time: 01/05/24  6:12 PM  Result Value Ref Range   Color, Urine AMBER (A) YELLOW    Comment: BIOCHEMICALS MAY BE AFFECTED BY COLOR   APPearance CLOUDY (A) CLEAR   Specific Gravity, Urine 1.017 1.005 - 1.030   pH 5.0 5.0 - 8.0   Glucose, UA NEGATIVE NEGATIVE mg/dL   Hgb urine dipstick NEGATIVE NEGATIVE   Bilirubin Urine NEGATIVE NEGATIVE   Ketones, ur 20 (A) NEGATIVE mg/dL   Protein, ur 899 (A) NEGATIVE mg/dL   Nitrite NEGATIVE NEGATIVE    Leukocytes,Ua LARGE (A) NEGATIVE   RBC / HPF 6-10 0 - 5 RBC/hpf   WBC, UA >50 0 - 5 WBC/hpf   Bacteria, UA MANY (A) NONE SEEN   Squamous Epithelial / HPF 0-5 0 - 5 /HPF   WBC Clumps PRESENT    Mucus PRESENT    Non Squamous Epithelial PRESENT (A) NONE SEEN    Comment: Performed at [facility], [address] [place], [place] [address]  Lactic acid, plasma     Status: None   Collection Time: 01/05/24 10:05 PM  Result Value Ref Range   Lactic Acid, Venous 1.1 0.5 - 1.9 mmol/L    Comment: Performed at [facility], [address] [place], [place] [address]  CBC     Status: Abnormal   Collection Time: 01/06/24  6:32 AM  Result Value Ref Range   WBC 31.5 (H) 4.0 - 10.5 K/uL   RBC 3.64 (L) 3.87 - 5.11 MIL/uL   Hemoglobin 10.5 (L) 12.0 - 15.0 g/dL   HCT 67.7 (L) 63.9 - 53.9 %   MCV 88.5 80.0 - 100.0 fL   MCH 28.8 26.0 - 34.0 pg   MCHC 32.6 30.0 - 36.0 g/dL   RDW 86.9 88.4 - 84.4 %   Platelets 251 150 - 400 K/uL   nRBC 0.1 0.0 - 0.2 %    Comment: Performed at [facility], [address] [place], [place] [address]  Basic metabolic panel with GFR     Status: Abnormal   Collection Time: 01/06/24  6:32 AM  Result Value Ref Range   Sodium 135 135 - 145 mmol/L   Potassium 3.5 3.5 -  5.1 mmol/L   Chloride 102 98 - 111 mmol/L   CO2 17 (L) 22 - 32 mmol/L   Glucose, Bld 89 70 - 99 mg/dL    Comment: Glucose reference range applies only to samples taken after fasting for at least 8 hours.   BUN 9 6 - 20 mg/dL   Creatinine, Ser 9.25 0.44 - 1.00 mg/dL   Calcium 9.3 8.9 - 89.6 mg/dL   GFR, Estimated >39 >39 mL/min    Comment: (NOTE) Calculated using the CKD-EPI Creatinine Equation (2021)    Anion gap 17 (H) 5 - 15    Comment: Performed at [facility], [address] [place], [place] [address]  Magnesium     Status: Abnormal   Collection Time: 01/06/24  6:32 AM  Result Value Ref Range   Magnesium 2.5 (H) 1.7 - 2.4 mg/dL    Comment: Performed at  [facility], [address] [place], [place] [address]  Phosphorus     Status: None   Collection Time: 01/06/24  6:32 AM  Result Value Ref Range   Phosphorus 2.6 2.5 - 4.6 mg/dL    Comment: Performed at [facility], [address] [place], [place] [address]  Lactic acid, plasma     Status: None   Collection Time: 01/06/24  1:38 PM  Result Value Ref Range   Lactic Acid, Venous 1.0 0.5 - 1.9 mmol/L    Comment: Performed at [facility], [address] [place], [place] [address]  Lactic acid, plasma     Status: None   Collection Time: 01/06/24  4:02 PM  Result Value Ref Range   Lactic Acid, Venous 1.0 0.5 - 1.9 mmol/L    Comment: Performed at [facility], [address] [place], [place] [address]    US  MAINE Comp + 14 Wk Result Date: 01/06/2024 CLINICAL DATA:  Pyelonephritis. EXAM: OBSTETRICAL ULTRASOUND >14 WKS AND TRANSVAGINAL OB US  FINDINGS: Number of Fetuses: 1 Heart Rate: 123 bpm Movement: Yes Presentation: Cephalic Previa: No Placental Location: Anterior Amniotic Fluid (Subjective): Normal Amniotic Fluid (Objective): Vertical pocket N/Acm AFI 14.2 cm (5%ile= 8.1 cm, 95%= 24.8 cm for 34 wks) FETAL BIOMETRY BPD:  8.91cm 36w 0d HC:    32.48cm 36w 5d AC:    30.48cm 34w 3d FL:    6.81cm 35w 0d Current Mean GA: 35w 2d    US  EDC: February 08, 2024 Assigned GA: 34w 6d    Assigned [facility]: February 11, 2024 Estimated Fetal Weight: 2565.7g    50.2%ile FETAL ANATOMY Lateral Ventricles: Appears normal Thalami/CSP: Appears normal Posterior Fossa: Not visualized Nuchal Region: Not visualized    NFT= N/A Upper Lip: Appears normal Spine: Not visualized 4 Chamber Heart on Left: Appears normal LVOT: Appears normal RVOT: Appears normal Stomach on Left: Appears normal 3 Vessel Cord: Appears normal Cord Insertion site: Not visualized Kidneys: Appears normal Bladder: Appears normal Extremities: Appears normal Sex: Female Technical Limitations: Fetal position and advanced  gestational age. Maternal Findings: Cervix:  N/A IMPRESSION: 1. A single, viable intrauterine pregnancy at approximately 34 weeks and 6 days gestation by ultrasound evaluation. 2. Limited study without evidence of fetal anatomy abnormality. Electronically Signed   By: Suzen Dials M.D.   On: 01/06/2024 15:53   MR ABDOMEN MRCP WO CONTRAST Result Date: 01/05/2024 EXAM: MRCP WITHOUT IV CONTRAST 01/05/2024 07:11:24 PM TECHNIQUE: Multisequence, multiplanar magnetic resonance images of the abdomen without intravenous contrast. MRCP sequences were performed. COMPARISON: CT scan 9522. CLINICAL HISTORY: eval choledocholithiasis. [redacted]weeks pregnant,  cholecystectomy 7 mo ago. acute pain, emesis, ALP/Bili elevated FINDINGS: LIVER: The liver is normal. No hepatic lesions or intrahepatic biliary dilatation. GALLBLADDER AND BILIARY SYSTEM: The gallbladder is surgically absent. Normal caliber and course of the common bile duct. No common bile duct stones are identified. SPLEEN: The spleen is normal in size. No splenic lesions. PANCREAS/PANCREATIC DUCT: The pancreas is normal. No inflammation or ductal dilatation. ADRENAL GLANDS: The adrenal glands are normal. KIDNEYS: Both kidneys demonstrate mild-to-moderate hydronephrosis and proximal hydroureter likely related to the patient's advanced stage pregnancy. The left kidney is slightly enlarged compared to the right and demonstrates areas of subtle T2 signal abnormality. There is also a small amount of perinephric fluid. Findings are concerning for pyelonephritis. Recommend correlation with urinalysis. Right kidney appears normal comparatively. LYMPH NODES: No retroperitoneal adenopathy. VASCULATURE: The aorta is normal in caliber. PERITONEUM: No ascites. ABDOMINAL WALL: No hernia. No mass. BOWEL: Stomach, duodenum, visualized small bowel and colon are unremarkable. No inflammatory changes or obstructive findings. BONES: The bony structures are unremarkable. SOFT TISSUES:  Unremarkable. MISCELLANEOUS: The lung bases are clear. No infiltrates or effusions. No pericardial effusion. Gravid uterus is noted extending well up into the abdomen consistent with late-stage pregnancy. The fetus is in a cephalic presentation. The anterior placenta is unremarkable. Normal appearing amniotic fluid volume. IMPRESSION: 1. No evidence of choledocholithiasis. 2. Mild-to-moderate bilateral hydronephrosis and proximal hydroureter, likely related to advanced pregnancy. 3. Left kidney findings suspicious for pyelonephritis; recommend urinalysis correlation. Electronically signed by: Maude Stammer MD 01/05/2024 07:31 PM EST RP Workstation: [id number]   MR 3D Recon At Scanner Result Date: 01/05/2024 EXAM: MRCP WITHOUT IV CONTRAST 01/05/2024 07:11:24 PM TECHNIQUE: Multisequence, multiplanar magnetic resonance images of the abdomen without intravenous contrast. MRCP sequences were performed. COMPARISON: CT scan 9522. CLINICAL HISTORY: eval choledocholithiasis. [redacted]weeks pregnant, cholecystectomy 7 mo ago. acute pain, emesis, ALP/Bili elevated FINDINGS: LIVER: The liver is normal. No hepatic lesions or intrahepatic biliary dilatation. GALLBLADDER AND BILIARY SYSTEM: The gallbladder is surgically absent. Normal caliber and course of the common bile duct. No common bile duct stones are identified. SPLEEN: The spleen is normal in size. No splenic lesions. PANCREAS/PANCREATIC DUCT: The pancreas is normal. No inflammation or ductal dilatation. ADRENAL GLANDS: The adrenal glands are normal. KIDNEYS: Both kidneys demonstrate mild-to-moderate hydronephrosis and proximal hydroureter likely related to the patient's advanced stage pregnancy. The left kidney is slightly enlarged compared to the right and demonstrates areas of subtle T2 signal abnormality. There is also a small amount of perinephric fluid. Findings are concerning for pyelonephritis. Recommend correlation with urinalysis. Right kidney appears normal  comparatively. LYMPH NODES: No retroperitoneal adenopathy. VASCULATURE: The aorta is normal in caliber. PERITONEUM: No ascites. ABDOMINAL WALL: No hernia. No mass. BOWEL: Stomach, duodenum, visualized small bowel and colon are unremarkable. No inflammatory changes or obstructive findings. BONES: The bony structures are unremarkable. SOFT TISSUES: Unremarkable. MISCELLANEOUS: The lung bases are clear. No infiltrates or effusions. No pericardial effusion. Gravid uterus is noted extending well up into the abdomen consistent with late-stage pregnancy. The fetus is in a cephalic presentation. The anterior placenta is unremarkable. Normal appearing amniotic fluid volume. IMPRESSION: 1. No evidence of choledocholithiasis. 2. Mild-to-moderate bilateral hydronephrosis and proximal hydroureter, likely related to advanced pregnancy. 3. Left kidney findings suspicious for pyelonephritis; recommend urinalysis correlation. Electronically signed by: Maude Stammer MD 01/05/2024 07:31 PM EST RP Workstation: [id number]   DG Chest Portable 1 View Result Date: 01/05/2024 CLINICAL DATA:  Nausea and vomiting, chest pain EXAM: PORTABLE CHEST 1 VIEW COMPARISON:  02/05/2023  FINDINGS: The heart size and mediastinal contours are within normal limits. Both lungs are clear. The visualized skeletal structures are unremarkable. IMPRESSION: No active disease. Electronically Signed   By: Ozell Daring M.D.   On: 01/05/2024 15:10    Current scheduled medications  docusate sodium  100 mg Oral Daily   metroNIDAZOLE   1 Application Vaginal QHS   prenatal multivitamin  1 tablet Oral Q1200    I have reviewed the patient's current medications.  ASSESSMENT: Principal Problem:   Pyelonephritis affecting pregnancy in third trimester    PLAN:  High risk antepartum  - Inpatient for pyelonephritis - Dr IVAR Dinsmore MD notified of plan of care  - Continue routine antepartum care  - Fetal monitoring: NST q shift  - NST reactive  -  Category I - Diet Regular diet - Activity: No restrictions - VTE Prophylaxis: Mechanical:  Antiembolism stockings, knee (TED hose) Bilateral lower extremities - Growth US  01/06/24 shows EFW 2565g (50.2%), AFI 14.2cm, cephalic presentation   Pyelonephritis - Last fever 01/06/24 @ 0827, highest temp 101.9 (01/05/24 @ 1955) - Continue Rocephin  IV q24hrs, blood cultures preliminary showed e. Coli and rocephin  is appropriate antibiotic - IVF resuscitation - Tachycardia improving with IV fluid bolus and acetaminophen  - Acetaminophen  for fever > 100.83F or mild pain - CBC and Cmp in AM, urine culture pending, blood culture preliminary results - Continuous pulse ox - Will plan to continue IV antibiotics until 24hrs fever free - hospitalist consulted for positive blood cultures  BV - Metrogel  0.75% vaginally q night x5  Nausea and vomiting - Zofran  4mg  ODT or IV q6hrs nausea or vomiting - Phenergan  25mg  IV q6hrs PRN vomiting despite Zofran  - Advance diet as tolerated - Maalox for chest pain from vomiting  Hypokalemia - Potassium improved from 2.9 -> 3.5 today - Received Kcl 10mEq IV x 3 - electrolyte replacement per pharmacy consult - repeat CMP in the AM  Edsel Blush, CNM Certified Nurse Midwife [facility]

## 2024-01-06 NOTE — Consult Note (Signed)
 Initial Consultation Note   Patient: Danielle Leon FMW:[id number] DOB: [birth year] PCP: [facility] DOA: 01/05/2024 DOS: the patient was seen and examined on 01/06/2024 Primary service: Tanda Edsel Fuller, *  Referring physician: Dr. Constantino Reason for consult: Pyelonephritis, E. coli bacteremia  Assessment/Plan: Assessment and Plan: No notes have been filed under this hospital service. Service: Hospitalist  Sepsis Acute pyelonephritis E. coli bacteremia -Sepsis is evidenced by fever, leukocytosis tachycardia, source infection is acute left-sided pyelonephritis.  Received 1.5 L of IV fluid bolus in the ED last night and initial cysts ceftriaxone  dosage. - Agreed with ceftriaxone  for now, according to wound culture result, can switch to p.o. once sepsis improved hopefully in the next 24-48 hours.  Consider extend antibiotic treatment to 7 to 10 days in total. - I sent a baseline lactic acid level - Continue IV hydration  Bilateral vaginosis - Agreed with your management with metronidazole  gel x 7 days. - Recommend husband get treatment of 7 days PO fluconazole too.  34+ weeks pregnancy -As per primary team    [facility] will follow along with you.  HPI: Danielle Leon is a 23 y.o. female with past medical history of recurrent UTIs, migraines, 34+ weeks pregnancy presented with fever vaginal discharge and left-sided flank pain.  Symptoms started 2 to 3 days ago, patient started to find some vaginal discharges.  Yesterday she started to have episode of subjective fever and chills and left-sided flank pain  stretching like as well as feeling nausea and frequent vomiting of stomach content on bowel nonbloody.  Came to ED and workup showed bilateral mild hydronephrosis likely secondary to pregnancy and signs of left kidney stranding implying for acute pyelonephritis.  UA showed 3+ WBC, white mount showed clue cells.  MRI of the abdomen pelvis showed  bilateral mild hydronephrosis secondary to pregnancy as well as left kidney stranding implying for pyelonephritis.  Patient was started on ceftriaxone  and received 1 dose of fluconazole.  This morning, blood culture came back positive for E. coli.  Review of Systems: As mentioned in the history of present illness. All other systems reviewed and are negative. 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 She started smoking about 4 years ago. She has a 0.5 pack-year smoking history. She has been exposed to tobacco smoke. She has never used smokeless tobacco. She reports current alcohol use of about 1.0 standard drink of alcohol per week. She reports that she does not currently use drugs.  Allergies  Allergen Reactions   Penicillin G    Penicillins Hives   Amoxicillin    Prenatal Vit-Fe Fumarate-Fa Dermatitis    Not allergic to Flinstones gummy vitamins   Prenatal Vitamins Rash    Not allergic to Flinstones gummy vitamins    Family History  Problem Relation Age of Onset   Healthy Maternal Grandmother    Stroke Maternal Grandfather    Hypertension Father    Anemia Mother    Heart attack Mother    Stroke Mother    Macrocephaly Brother    Autism Brother    Intellectual disability Brother    Autism Brother  Healthy Son     Prior to Admission medications   Medication Sig Start Date End Date Taking? Authorizing Provider  acetaminophen  (TYLENOL ) 500 MG tablet Take 2 tablets (1,000 mg total) by mouth every 6 (six) hours as needed (for pain scale < 4  OR  temperature  >/=  100.5 F). 12/20/23  Yes Vernel Therisa HERO, CNM  cyclobenzaprine  (FLEXERIL ) 10 MG tablet Take  10 mg by mouth 3 (three) times daily as needed for muscle spasms. 01/03/24 02/02/24 Yes [provider]  metroNIDAZOLE  (FLAGYL ) 500 MG tablet Take 1 tablet (500 mg total) by mouth 2 (two) times daily for 7 days. 01/02/24 01/09/24 Yes Myron Nest, CNM  Pediatric Multivitamins-Iron (FLINTSTONES COMPLETE PO) Take by mouth.   Yes [provider]  sertraline (ZOLOFT) 50 MG tablet Take 50 mg by mouth daily.   Yes [provider]  famotidine  (PEPCID ) 20 MG tablet Take 1 tablet (20 mg total) by mouth 2 (two) times daily. 02/05/23 03/07/23  Viviann Pastor, MD    Physical Exam: Vitals:   01/06/24 0827 01/06/24 0900 01/06/24 0946 01/06/24 1209  BP: 118/66   100/60  Pulse: (!) 124 (!) 112 (!) 112 (!) 104  Resp: 18   17  Temp: (!) 100.4 F (38 C)  98.8 F (37.1 C) 98.2 F (36.8 C)  TempSrc:   Oral Oral  SpO2: 99% 94% 92% 97%  Weight:      Height:       Eyes: PERRL, lids and conjunctivae normal ENMT: Mucous membranes are moist. Posterior pharynx clear of any exudate or lesions.Normal dentition.  Neck: normal, supple, no masses, no thyromegaly Respiratory: clear to auscultation bilaterally, no wheezing, no crackles. Normal respiratory effort. No accessory muscle use.  Cardiovascular: Regular rate and rhythm, no murmurs / rubs / gallops. No extremity edema. 2+ pedal pulses. No carotid bruits.  Abdomen: no tenderness, no masses palpated. No hepatosplenomegaly. Bowel sounds positive.  Musculoskeletal: no clubbing / cyanosis. No joint deformity upper and lower extremities. Good ROM, no contractures. Normal muscle tone.  Skin: no rashes, lesions, ulcers. No induration Neurologic: CN 2-12 grossly intact. Sensation intact, DTR normal.  Muscle strength 5/5 on both sides Psychiatric: Normal judgment and insight. Alert and oriented x 3. Normal mood.    Data Reviewed:  Blood work CBC BMP done yesterday and MRI of the abdomen pelvis reviewed.  Family Communication: Husband at  bedside Primary team communication: OB/GYN team. Thank you very much for involving us  in the care of your patient.  Author: Cort ONEIDA Mana, MD 01/06/2024 1:21 PM  For on call review [email or web address].

## 2024-01-06 NOTE — Telephone Encounter (Signed)
 Called patient after receiving call to patient pager. Patient reports took too long to call back but she was having chest pain and went to [place]. Reviewed with patient is she ever has concerns she can always present to the hospital. Patient currently at [facility] for work up of UTI and chest pain.   Geroge Battiest, MD OBGYN Resident, PGY-4 [facility]

## 2024-01-07 DIAGNOSIS — B9620 Unspecified Escherichia coli [E. coli] as the cause of diseases classified elsewhere: Secondary | ICD-10-CM | POA: Diagnosis not present

## 2024-01-07 DIAGNOSIS — Z3A35 35 weeks gestation of pregnancy: Secondary | ICD-10-CM | POA: Diagnosis not present

## 2024-01-07 DIAGNOSIS — R7881 Bacteremia: Secondary | ICD-10-CM | POA: Diagnosis not present

## 2024-01-07 DIAGNOSIS — O2303 Infections of kidney in pregnancy, third trimester: Secondary | ICD-10-CM | POA: Diagnosis not present

## 2024-01-07 LAB — CBC
HCT: 27.6 % — ABNORMAL LOW (ref 36.0–46.0)
Hemoglobin: 9 g/dL — ABNORMAL LOW (ref 12.0–15.0)
MCH: 28.4 pg (ref 26.0–34.0)
MCHC: 32.6 g/dL (ref 30.0–36.0)
MCV: 87.1 fL (ref 80.0–100.0)
Platelets: 243 K/uL (ref 150–400)
RBC: 3.17 MIL/uL — ABNORMAL LOW (ref 3.87–5.11)
RDW: 13 % (ref 11.5–15.5)
WBC: 21.8 K/uL — ABNORMAL HIGH (ref 4.0–10.5)
nRBC: 0.2 % (ref 0.0–0.2)

## 2024-01-07 LAB — PATHOLOGIST SMEAR REVIEW

## 2024-01-07 MED ORDER — FAMOTIDINE 20 MG PO TABS
20.0000 mg | ORAL_TABLET | Freq: Every day | ORAL | Status: DC
  Administered 2024-01-07: 20 mg via ORAL
  Filled 2024-01-07: qty 1

## 2024-01-07 MED ORDER — MAGNESIUM OXIDE -MG SUPPLEMENT 400 (240 MG) MG PO TABS
400.0000 mg | ORAL_TABLET | Freq: Every day | ORAL | Status: DC
Start: 2024-01-07 — End: 2024-01-09
  Administered 2024-01-08: 400 mg via ORAL
  Filled 2024-01-07 (×3): qty 1

## 2024-01-07 MED ORDER — SERTRALINE HCL 25 MG PO TABS
50.0000 mg | ORAL_TABLET | Freq: Every day | ORAL | Status: DC
Start: 2024-01-08 — End: 2024-01-07

## 2024-01-07 MED ORDER — PROMETHAZINE HCL 25 MG PO TABS
25.0000 mg | ORAL_TABLET | Freq: Four times a day (QID) | ORAL | Status: DC | PRN
  Administered 2024-01-07: 25 mg via ORAL
  Filled 2024-01-07: qty 1

## 2024-01-07 MED ORDER — ACETAMINOPHEN 500 MG PO TABS
1000.0000 mg | ORAL_TABLET | Freq: Four times a day (QID) | ORAL | Status: DC | PRN
  Administered 2024-01-07: 1000 mg via ORAL
  Filled 2024-01-07: qty 2

## 2024-01-07 MED ORDER — FAMOTIDINE 20 MG PO TABS
20.0000 mg | ORAL_TABLET | Freq: Two times a day (BID) | ORAL | Status: DC
Start: 2024-01-08 — End: 2024-01-09
  Administered 2024-01-08 – 2024-01-09 (×3): 20 mg via ORAL
  Filled 2024-01-07 (×3): qty 1

## 2024-01-07 NOTE — Progress Notes (Signed)
   01/07/24 1439  Fetal Heart Rate A  Mode External  Baseline Rate (A) 150 bpm  Variability 6-25 BPM  Accelerations 15 x 15  Decelerations None  Uterine Activity  Mode Toco  Contraction Frequency (min) none noted   Reactive NST.

## 2024-01-07 NOTE — Progress Notes (Signed)
 PROGRESS NOTE    Danielle Leon  FMW:[id number] DOB: May 28, 2000 DOA: 01/05/2024 PCP: [facility]  Chief Complaint  Patient presents with   Emesis    PT reports one day h/o nausea vomiting; she is [redacted] weeks pregnant G4/P3/AB1 and also having tightening of the area from her umbilicus upwards that comes and goes every few minutes    Hospital Course:  Danielle Leon 23 year old female [redacted] weeks pregnant who presented with fever, vaginal discharge, left-sided flank pain.  Mario abdomen pelvis showed bilateral mild hydronephrosis secondary to pregnancy as well as left kidney stranding and plan pyelonephritis.  She was started on ceftriaxone .  Blood cultures later resulted positive for E. coli.  We were consulted in the context of pyelonephritis and E. coli bacteremia.  Subjective: No acute events this morning.  Patient denying any acute pain at this time.  Her partner is at bedside.  We discussed plan to stay in house for ongoing IV antibiotic therapy until we can safely transition to p.o.   Objective: Vitals:   01/06/24 2300 01/07/24 0310 01/07/24 0724 01/07/24 1247  BP: 104/70 103/68 111/68 108/62  Pulse: (!) 112 (!) 102 (!) 121 (!) 107  Resp:  18    Temp: 98.5 F (36.9 C) 98.4 F (36.9 C) 98.7 F (37.1 C) 97.7 F (36.5 C)  TempSrc: Oral Oral Oral   SpO2: 100% 100% 98% 100%  Weight:      Height:        Intake/Output Summary (Last 24 hours) at 01/07/2024 1617 Last data filed at 01/07/2024 1300 Gross per 24 hour  Intake 1275 ml  Output 1000 ml  Net 275 ml   Filed Weights   01/05/24 1430 01/05/24 1440  Weight: 90 kg 90 kg    Examination: General exam: Appears calm and comfortable, NAD Respiratory system: No work of breathing, does appear dyspneic when speaking, symmetric chest wall expansion Cardiovascular system: S1 & S2 heard, RRR.  Gastrointestinal system: Rapid abdomen, no flank tenderness Neuro: Alert and oriented. No focal neurological  deficits. Psychiatry: Demonstrates appropriate judgement and insight. Mood & affect appropriate for situation.   Assessment & Plan:  Principal Problem:   Pyelonephritis affecting pregnancy in third trimester   Sepsis E. coli pyelonephritis E. coli bacteremia - Septic criteria on arrival: Fever, leukocytosis, tachycardia. - Status post IV fluids - Continue with ceftriaxone  for now, pending E. coli susceptibilities - Will likely need 5 total days of parenteral therapy before we can transition to p.o. - Have discussed this directly with the patient as well as her midwife. - Continue trending CBC, WBC is appropriately responsive.  No fever at this time - As needed Tylenol  for pain control  Bacterial vaginosis - Agree with gynecology service  [redacted] weeks pregnant - Defer obstetric care to primary OB team  Body mass index is 32.02 kg/m. - Outpatient follow up for lifestyle modification and risk factor management  Consultants:  Treatment Team:  Consulting Physician: Bobbette Hansen Team 5, MD Consulting Physician: Cadell Gabrielson, DO  Procedures:    Antimicrobials:  Anti-infectives (From admission, onward)    Start     Dose/Rate Route Frequency Ordered Stop   01/05/24 2300  cefTRIAXone  (ROCEPHIN ) 2 g in sodium chloride 0.9 % 100 mL IVPB        2 g 200 mL/hr over 30 Minutes Intravenous Every 24 hours 01/05/24 2013 01/12/24 2259   01/05/24 1730  ceFEPIme (MAXIPIME) 2 g in sodium chloride 0.9 % 100 mL IVPB  2 g 200 mL/hr over 30 Minutes Intravenous  Once 01/05/24 1718 01/05/24 1810   01/05/24 1730  metroNIDAZOLE  (FLAGYL ) IVPB 500 mg        500 mg 100 mL/hr over 60 Minutes Intravenous  Once 01/05/24 1718 01/05/24 2004       Data Reviewed: I have personally reviewed following labs and imaging studies CBC: Recent Labs  Lab 01/05/24 1446 01/06/24 0632 01/07/24 0542  WBC 38.6* 31.5* 21.8*  NEUTROABS 30.2*  --   --   HGB 10.7* 10.5* 9.0*  HCT 31.7* 32.2* 27.6*   MCV 84.8 88.5 87.1  PLT 280 251 243   Basic Metabolic Panel: Recent Labs  Lab 01/05/24 1446 01/06/24 0632  NA 133* 135  K 2.9* 3.5  CL 99 102  CO2 17* 17*  GLUCOSE 88 89  BUN 8 9  CREATININE 0.85 0.74  CALCIUM 9.0 9.3  MG 1.3* 2.5*  PHOS  --  2.6   GFR: Estimated Creatinine Clearance: 123.6 mL/min (by C-G formula based on SCr of 0.74 mg/dL). Liver Function Tests: Recent Labs  Lab 01/05/24 1446  AST 27  ALT 20  ALKPHOS 192*  BILITOT 1.5*  PROT 6.7  ALBUMIN 3.1*   CBG: No results for input(s): GLUCAP in the last 168 hours.  Recent Results (from the past 240 hours)  Wet prep, genital     Status: Abnormal   Collection Time: 01/02/24  6:56 PM  Result Value Ref Range Status   Yeast Wet Prep HPF POC NONE SEEN NONE SEEN Final   Trich, Wet Prep NONE SEEN NONE SEEN Final   Clue Cells Wet Prep HPF POC PRESENT (A) NONE SEEN Final   WBC, Wet Prep HPF POC <10 <10 Final   Sperm NONE SEEN  Final    Comment: Performed at [facility], [address], [place], [place] [address]  Resp panel by RT-PCR (RSV, Flu A&B, Covid) Anterior Nasal Swab     Status: None   Collection Time: 01/05/24  2:46 PM   Specimen: Anterior Nasal Swab  Result Value Ref Range Status   SARS Coronavirus 2 by RT PCR NEGATIVE NEGATIVE Final    Comment: (NOTE) SARS-CoV-2 target nucleic acids are NOT DETECTED.  The SARS-CoV-2 RNA is generally detectable in upper respiratory specimens during the acute phase of infection. The lowest concentration of SARS-CoV-2 viral copies this assay can detect is 138 copies/mL. A negative result does not preclude SARS-Cov-2 infection and should not be used as the sole basis for treatment or other patient management decisions. A negative result may occur with  improper specimen collection/handling, submission of specimen other than nasopharyngeal swab, presence of viral mutation(s) within the areas targeted by this assay, and inadequate number of  viral copies(<138 copies/mL). A negative result must be combined with clinical observations, patient history, and epidemiological information. The expected result is Negative.  Fact Sheet for Patients:  bloggercourse.com  Fact Sheet for Healthcare Providers:  seriousbroker.it  This test is no t yet approved or cleared by the [place]  FDA and  has been authorized for detection and/or diagnosis of SARS-CoV-2 by FDA under an Emergency Use Authorization (EUA). This EUA will remain  in effect (meaning this test can be used) for the duration of the COVID-19 declaration under Section 564(b)(1) of the Act, 21 U.S.C.section 360bbb-3(b)(1), unless the authorization is terminated  or revoked sooner.       Influenza A by PCR NEGATIVE NEGATIVE Final   Influenza B by PCR NEGATIVE NEGATIVE Final  Comment: (NOTE) The Xpert Xpress SARS-CoV-2/FLU/RSV plus assay is intended as an aid in the diagnosis of influenza from Nasopharyngeal swab specimens and should not be used as a sole basis for treatment. Nasal washings and aspirates are unacceptable for Xpert Xpress SARS-CoV-2/FLU/RSV testing.  Fact Sheet for Patients: bloggercourse.com  Fact Sheet for Healthcare Providers: seriousbroker.it  This test is not yet approved or cleared by the [place]  FDA and has been authorized for detection and/or diagnosis of SARS-CoV-2 by FDA under an Emergency Use Authorization (EUA). This EUA will remain in effect (meaning this test can be used) for the duration of the COVID-19 declaration under Section 564(b)(1) of the Act, 21 U.S.C. section 360bbb-3(b)(1), unless the authorization is terminated or revoked.     Resp Syncytial Virus by PCR NEGATIVE NEGATIVE Final    Comment: (NOTE) Fact Sheet for Patients: bloggercourse.com  Fact Sheet for Healthcare  Providers: seriousbroker.it  This test is not yet approved or cleared by the [place]  FDA and has been authorized for detection and/or diagnosis of SARS-CoV-2 by FDA under an Emergency Use Authorization (EUA). This EUA will remain in effect (meaning this test can be used) for the duration of the COVID-19 declaration under Section 564(b)(1) of the Act, 21 U.S.C. section 360bbb-3(b)(1), unless the authorization is terminated or revoked.  Performed at [facility], [address] [place], [place] [address]   Blood culture (routine x 2)     Status: None (Preliminary result)   Collection Time: 01/05/24  4:25 PM   Specimen: BLOOD  Result Value Ref Range Status   Specimen Description BLOOD RIGHT ANTECUBITAL  Final   Special Requests   Final    BOTTLES DRAWN AEROBIC AND ANAEROBIC Blood Culture adequate volume   Culture   Final    NO GROWTH 2 DAYS Performed at [facility], [address] [place], [place] [address]    Report Status PENDING  Incomplete  Blood culture (routine x 2)     Status: Abnormal (Preliminary result)   Collection Time: 01/05/24  4:25 PM   Specimen: BLOOD  Result Value Ref Range Status   Specimen Description   Final    BLOOD LEFT ANTECUBITAL Performed at [facility], [address] [place], [place] [address]    Special Requests   Final    BOTTLES DRAWN AEROBIC AND ANAEROBIC Blood Culture adequate volume Performed at [facility], [address] [place], [place] [address]    Culture  Setup Time   Final    IN BOTH AEROBIC AND ANAEROBIC BOTTLES GRAM NEGATIVE RODS CRITICAL RESULT CALLED TO, READ BACK BY AND VERIFIED WITH: NATHAN B., PHARM D AT 9381 01/06/24 RAM    Culture (A)  Final    ESCHERICHIA COLI SUSCEPTIBILITIES TO FOLLOW Performed at [facility], [address], [place], [place] [address]    Report Status PENDING  Incomplete  Blood Culture ID Panel (Reflexed)     Status:  Abnormal   Collection Time: 01/05/24  4:25 PM  Result Value Ref Range Status   Enterococcus faecalis NOT DETECTED NOT DETECTED Final   Enterococcus Faecium NOT DETECTED NOT DETECTED Final   Listeria monocytogenes NOT DETECTED NOT DETECTED Final   Staphylococcus species NOT DETECTED NOT DETECTED Final   Staphylococcus aureus (BCID) NOT DETECTED NOT DETECTED Final   Staphylococcus epidermidis NOT DETECTED NOT DETECTED Final   Staphylococcus lugdunensis NOT DETECTED NOT DETECTED Final   Streptococcus species NOT DETECTED NOT DETECTED Final   Streptococcus agalactiae NOT  DETECTED NOT DETECTED Final   Streptococcus pneumoniae NOT DETECTED NOT DETECTED Final   Streptococcus pyogenes NOT DETECTED NOT DETECTED Final   A.calcoaceticus-baumannii NOT DETECTED NOT DETECTED Final   Bacteroides fragilis NOT DETECTED NOT DETECTED Final   Enterobacterales DETECTED (A) NOT DETECTED Final    Comment: Enterobacterales represent a large order of gram negative bacteria, not a single organism. CRITICAL RESULT CALLED TO, READ BACK BY AND VERIFIED WITH: RANKIN WENDI GOWER D AT 9381 01/06/24 RAM    Enterobacter cloacae complex NOT DETECTED NOT DETECTED Final   Escherichia coli DETECTED (A) NOT DETECTED Final    Comment: CRITICAL RESULT CALLED TO, READ BACK BY AND VERIFIED WITH: RANKIN WENDI GOWER D AT 0618 01/06/24 RAM    Klebsiella aerogenes NOT DETECTED NOT DETECTED Final   Klebsiella oxytoca NOT DETECTED NOT DETECTED Final   Klebsiella pneumoniae NOT DETECTED NOT DETECTED Final   Proteus species NOT DETECTED NOT DETECTED Final   Salmonella species NOT DETECTED NOT DETECTED Final   Serratia marcescens NOT DETECTED NOT DETECTED Final   Haemophilus influenzae NOT DETECTED NOT DETECTED Final   Neisseria meningitidis NOT DETECTED NOT DETECTED Final   Pseudomonas aeruginosa NOT DETECTED NOT DETECTED Final   Stenotrophomonas maltophilia NOT DETECTED NOT DETECTED Final   Candida albicans NOT DETECTED NOT DETECTED  Final   Candida auris NOT DETECTED NOT DETECTED Final   Candida glabrata NOT DETECTED NOT DETECTED Final   Candida krusei NOT DETECTED NOT DETECTED Final   Candida parapsilosis NOT DETECTED NOT DETECTED Final   Candida tropicalis NOT DETECTED NOT DETECTED Final   Cryptococcus neoformans/gattii NOT DETECTED NOT DETECTED Final   CTX-M ESBL NOT DETECTED NOT DETECTED Final   Carbapenem resistance IMP NOT DETECTED NOT DETECTED Final   Carbapenem resistance KPC NOT DETECTED NOT DETECTED Final   Carbapenem resistance NDM NOT DETECTED NOT DETECTED Final   Carbapenem resist OXA 48 LIKE NOT DETECTED NOT DETECTED Final   Carbapenem resistance VIM NOT DETECTED NOT DETECTED Final    Comment: Performed at [facility], [address], [place], [place] [address]  Urine Culture     Status: Abnormal (Preliminary result)   Collection Time: 01/05/24  6:11 PM   Specimen: Urine, Clean Catch  Result Value Ref Range Status   Specimen Description   Final    URINE, CLEAN CATCH Performed at [facility], [address] [place], [place] [address]    Special Requests   Final    NONE Performed at [facility], [address], [place], [place] [address]    Culture (A)  Final    >=100,000 COLONIES/mL ESCHERICHIA COLI SUSCEPTIBILITIES TO FOLLOW Performed at [facility], [address], [place], [place] [address]    Report Status PENDING  Incomplete     Radiology Studies: US  OB Comp + 14 Wk Result Date: 01/06/2024 CLINICAL DATA:  Pyelonephritis. EXAM: OBSTETRICAL ULTRASOUND >14 WKS AND TRANSVAGINAL OB US  FINDINGS: Number of Fetuses: 1 Heart Rate: 123 bpm Movement: Yes Presentation: Cephalic Previa: No Placental Location: Anterior Amniotic Fluid (Subjective): Normal Amniotic Fluid (Objective): Vertical pocket N/Acm AFI 14.2 cm (5%ile= 8.1 cm, 95%= 24.8 cm for 34 wks) FETAL BIOMETRY BPD:  8.91cm 36w 0d HC:    32.48cm 36w 5d AC:    30.48cm 34w 3d FL:    6.81cm 35w 0d Current  Mean GA: 35w 2d    US  EDC: February 08, 2024 Assigned GA: 34w 6d    Assigned [facility]: February 11, 2024 Estimated Fetal Weight: 2565.7g  50.2%ile FETAL ANATOMY Lateral Ventricles: Appears normal Thalami/CSP: Appears normal Posterior Fossa: Not visualized Nuchal Region: Not visualized    NFT= N/A Upper Lip: Appears normal Spine: Not visualized 4 Chamber Heart on Left: Appears normal LVOT: Appears normal RVOT: Appears normal Stomach on Left: Appears normal 3 Vessel Cord: Appears normal Cord Insertion site: Not visualized Kidneys: Appears normal Bladder: Appears normal Extremities: Appears normal Sex: Female Technical Limitations: Fetal position and advanced gestational age. Maternal Findings: Cervix:  N/A IMPRESSION: 1. A single, viable intrauterine pregnancy at approximately 34 weeks and 6 days gestation by ultrasound evaluation. 2. Limited study without evidence of fetal anatomy abnormality. Electronically Signed   By: Suzen Dials M.D.   On: 01/06/2024 15:53   MR ABDOMEN MRCP WO CONTRAST Result Date: 01/05/2024 EXAM: MRCP WITHOUT IV CONTRAST 01/05/2024 07:11:24 PM TECHNIQUE: Multisequence, multiplanar magnetic resonance images of the abdomen without intravenous contrast. MRCP sequences were performed. COMPARISON: CT scan 9522. CLINICAL HISTORY: eval choledocholithiasis. [redacted]weeks pregnant, cholecystectomy 7 mo ago. acute pain, emesis, ALP/Bili elevated FINDINGS: LIVER: The liver is normal. No hepatic lesions or intrahepatic biliary dilatation. GALLBLADDER AND BILIARY SYSTEM: The gallbladder is surgically absent. Normal caliber and course of the common bile duct. No common bile duct stones are identified. SPLEEN: The spleen is normal in size. No splenic lesions. PANCREAS/PANCREATIC DUCT: The pancreas is normal. No inflammation or ductal dilatation. ADRENAL GLANDS: The adrenal glands are normal. KIDNEYS: Both kidneys demonstrate mild-to-moderate hydronephrosis and proximal hydroureter likely related to the  patient's advanced stage pregnancy. The left kidney is slightly enlarged compared to the right and demonstrates areas of subtle T2 signal abnormality. There is also a small amount of perinephric fluid. Findings are concerning for pyelonephritis. Recommend correlation with urinalysis. Right kidney appears normal comparatively. LYMPH NODES: No retroperitoneal adenopathy. VASCULATURE: The aorta is normal in caliber. PERITONEUM: No ascites. ABDOMINAL WALL: No hernia. No mass. BOWEL: Stomach, duodenum, visualized small bowel and colon are unremarkable. No inflammatory changes or obstructive findings. BONES: The bony structures are unremarkable. SOFT TISSUES: Unremarkable. MISCELLANEOUS: The lung bases are clear. No infiltrates or effusions. No pericardial effusion. Gravid uterus is noted extending well up into the abdomen consistent with late-stage pregnancy. The fetus is in a cephalic presentation. The anterior placenta is unremarkable. Normal appearing amniotic fluid volume. IMPRESSION: 1. No evidence of choledocholithiasis. 2. Mild-to-moderate bilateral hydronephrosis and proximal hydroureter, likely related to advanced pregnancy. 3. Left kidney findings suspicious for pyelonephritis; recommend urinalysis correlation. Electronically signed by: Maude Stammer MD 01/05/2024 07:31 PM EST RP Workstation: [id number]   MR 3D Recon At Scanner Result Date: 01/05/2024 EXAM: MRCP WITHOUT IV CONTRAST 01/05/2024 07:11:24 PM TECHNIQUE: Multisequence, multiplanar magnetic resonance images of the abdomen without intravenous contrast. MRCP sequences were performed. COMPARISON: CT scan 9522. CLINICAL HISTORY: eval choledocholithiasis. [redacted]weeks pregnant, cholecystectomy 7 mo ago. acute pain, emesis, ALP/Bili elevated FINDINGS: LIVER: The liver is normal. No hepatic lesions or intrahepatic biliary dilatation. GALLBLADDER AND BILIARY SYSTEM: The gallbladder is surgically absent. Normal caliber and course of the common bile duct. No  common bile duct stones are identified. SPLEEN: The spleen is normal in size. No splenic lesions. PANCREAS/PANCREATIC DUCT: The pancreas is normal. No inflammation or ductal dilatation. ADRENAL GLANDS: The adrenal glands are normal. KIDNEYS: Both kidneys demonstrate mild-to-moderate hydronephrosis and proximal hydroureter likely related to the patient's advanced stage pregnancy. The left kidney is slightly enlarged compared to the right and demonstrates areas of subtle T2 signal abnormality. There is also a small amount of perinephric fluid. Findings are concerning for  pyelonephritis. Recommend correlation with urinalysis. Right kidney appears normal comparatively. LYMPH NODES: No retroperitoneal adenopathy. VASCULATURE: The aorta is normal in caliber. PERITONEUM: No ascites. ABDOMINAL WALL: No hernia. No mass. BOWEL: Stomach, duodenum, visualized small bowel and colon are unremarkable. No inflammatory changes or obstructive findings. BONES: The bony structures are unremarkable. SOFT TISSUES: Unremarkable. MISCELLANEOUS: The lung bases are clear. No infiltrates or effusions. No pericardial effusion. Gravid uterus is noted extending well up into the abdomen consistent with late-stage pregnancy. The fetus is in a cephalic presentation. The anterior placenta is unremarkable. Normal appearing amniotic fluid volume. IMPRESSION: 1. No evidence of choledocholithiasis. 2. Mild-to-moderate bilateral hydronephrosis and proximal hydroureter, likely related to advanced pregnancy. 3. Left kidney findings suspicious for pyelonephritis; recommend urinalysis correlation. Electronically signed by: Maude Stammer MD 01/05/2024 07:31 PM EST RP Workstation: [id number]    Scheduled Meds:  docusate sodium  100 mg Oral Daily   famotidine   20 mg Oral Daily   magnesium oxide  400 mg Oral QHS   metroNIDAZOLE   1 Application Vaginal QHS   prenatal multivitamin  1 tablet Oral Q1200   Continuous Infusions:  cefTRIAXone  (ROCEPHIN )  IV  2 g (01/06/24 2246)     LOS: 2 days  MDM: Patient is high risk for one or more organ failure.  They necessitate ongoing hospitalization for continued IV therapies and subsequent lab monitoring. Total time spent interpreting labs and vitals, reviewing the medical record, coordinating care amongst consultants and care team members, directly assessing and discussing care with the patient and/or family: 55 min  Kieran Nachtigal, DO [facility]  To contact the attending physician between 7A-7P please use Epic Chat. To contact the covering physician during after hours 7P-7A, please review Amion.  01/07/2024, 4:17 PM   *This document has been created with the assistance of dictation software. Please excuse typographical errors. *

## 2024-01-07 NOTE — Progress Notes (Signed)
 ANTEPARTUM PROGRESS NOTE  Danielle Leon is a 23 y.o. [phone] at [redacted]w[redacted]d who is admitted for pyelonephritis and E. Coli bacteremia.   Estimated Date of Delivery: 02/11/24  Length of Stay:  2 Days. Admitted 01/05/2024  Subjective: Reports continued headache and nausea. Improves with medication. Using Maalox for heartburn.   She reports:  -active fetal movement -no leakage of fluid -no vaginal bleeding -no contractions  Vitals:  BP 108/60 (BP Location: Right Arm)   Pulse (!) 110   Temp 99.3 F (37.4 C) (Oral)   Resp 20   Ht 5' 6 (1.676 m)   Wt 90 kg   LMP 02/14/2023 (Approximate)   SpO2 94%   BMI 32.02 kg/m  Physical Examination: General:   alert, cooperative, and no distress  Skin:  normal  Neurologic:    Alert & oriented x 3  Lungs:  Normal respiratory effort  Abdomen: gravid and non-tender  Pelvis:  Exam deferred.  Extremities: : non-tender, symmetric, No edema bilaterally.  DTRs: 2+/2+      Results for orders placed or performed during the hospital encounter of 01/05/24 (from the past 48 hours)  Lactic acid, plasma     Status: None   Collection Time: 01/05/24 10:05 PM  Result Value Ref Range   Lactic Acid, Venous 1.1 0.5 - 1.9 mmol/L    Comment: Performed at [facility], [address] [place], [place] [address]  CBC     Status: Abnormal   Collection Time: 01/06/24  6:32 AM  Result Value Ref Range   WBC 31.5 (H) 4.0 - 10.5 K/uL   RBC 3.64 (L) 3.87 - 5.11 MIL/uL   Hemoglobin 10.5 (L) 12.0 - 15.0 g/dL   HCT 67.7 (L) 63.9 - 53.9 %   MCV 88.5 80.0 - 100.0 fL   MCH 28.8 26.0 - 34.0 pg   MCHC 32.6 30.0 - 36.0 g/dL   RDW 86.9 88.4 - 84.4 %   Platelets 251 150 - 400 K/uL   nRBC 0.1 0.0 - 0.2 %    Comment: Performed at [facility], [address] [place], [place] [address]  Basic metabolic panel with GFR     Status: Abnormal   Collection Time: 01/06/24  6:32 AM  Result Value Ref Range   Sodium 135 135 - 145 mmol/L   Potassium 3.5 3.5 -  5.1 mmol/L   Chloride 102 98 - 111 mmol/L   CO2 17 (L) 22 - 32 mmol/L   Glucose, Bld 89 70 - 99 mg/dL    Comment: Glucose reference range applies only to samples taken after fasting for at least 8 hours.   BUN 9 6 - 20 mg/dL   Creatinine, Ser 9.25 0.44 - 1.00 mg/dL   Calcium 9.3 8.9 - 89.6 mg/dL   GFR, Estimated >39 >39 mL/min    Comment: (NOTE) Calculated using the CKD-EPI Creatinine Equation (2021)    Anion gap 17 (H) 5 - 15    Comment: Performed at [facility], [address] [place], [place] [address]  Magnesium     Status: Abnormal   Collection Time: 01/06/24  6:32 AM  Result Value Ref Range   Magnesium 2.5 (H) 1.7 - 2.4 mg/dL    Comment: Performed at [facility], [address], [place], [place] [address]  Phosphorus     Status: None   Collection Time: 01/06/24  6:32 AM  Result Value Ref Range   Phosphorus 2.6 2.5 - 4.6 mg/dL  Comment: Performed at [facility], [address] [place], [place] [address]  Lactic acid, plasma     Status: None   Collection Time: 01/06/24  1:38 PM  Result Value Ref Range   Lactic Acid, Venous 1.0 0.5 - 1.9 mmol/L    Comment: Performed at [facility], [address] [place], [place] [address]  Lactic acid, plasma     Status: None   Collection Time: 01/06/24  4:02 PM  Result Value Ref Range   Lactic Acid, Venous 1.0 0.5 - 1.9 mmol/L    Comment: Performed at [facility], [address] [place], [place] [address]  CBC     Status: Abnormal   Collection Time: 01/07/24  5:42 AM  Result Value Ref Range   WBC 21.8 (H) 4.0 - 10.5 K/uL   RBC 3.17 (L) 3.87 - 5.11 MIL/uL   Hemoglobin 9.0 (L) 12.0 - 15.0 g/dL   HCT 72.3 (L) 63.9 - 53.9 %   MCV 87.1 80.0 - 100.0 fL   MCH 28.4 26.0 - 34.0 pg   MCHC 32.6 30.0 - 36.0 g/dL   RDW 86.9 88.4 - 84.4 %   Platelets 243 150 - 400 K/uL   nRBC 0.2 0.0 - 0.2 %    Comment: Performed at [facility], [address] [place], [place] [address]     US  MAINE Comp + 14 Wk Result Date: 01/06/2024 CLINICAL DATA:  Pyelonephritis. EXAM: OBSTETRICAL ULTRASOUND >14 WKS AND TRANSVAGINAL OB US  FINDINGS: Number of Fetuses: 1 Heart Rate: 123 bpm Movement: Yes Presentation: Cephalic Previa: No Placental Location: Anterior Amniotic Fluid (Subjective): Normal Amniotic Fluid (Objective): Vertical pocket N/Acm AFI 14.2 cm (5%ile= 8.1 cm, 95%= 24.8 cm for 34 wks) FETAL BIOMETRY BPD:  8.91cm 36w 0d HC:    32.48cm 36w 5d AC:    30.48cm 34w 3d FL:    6.81cm 35w 0d Current Mean GA: 35w 2d    US  EDC: February 08, 2024 Assigned GA: 34w 6d    Assigned [facility]: February 11, 2024 Estimated Fetal Weight: 2565.7g    50.2%ile FETAL ANATOMY Lateral Ventricles: Appears normal Thalami/CSP: Appears normal Posterior Fossa: Not visualized Nuchal Region: Not visualized    NFT= N/A Upper Lip: Appears normal Spine: Not visualized 4 Chamber Heart on Left: Appears normal LVOT: Appears normal RVOT: Appears normal Stomach on Left: Appears normal 3 Vessel Cord: Appears normal Cord Insertion site: Not visualized Kidneys: Appears normal Bladder: Appears normal Extremities: Appears normal Sex: Female Technical Limitations: Fetal position and advanced gestational age. Maternal Findings: Cervix:  N/A IMPRESSION: 1. A single, viable intrauterine pregnancy at approximately 34 weeks and 6 days gestation by ultrasound evaluation. 2. Limited study without evidence of fetal anatomy abnormality. Electronically Signed   By: Suzen Dials M.D.   On: 01/06/2024 15:53    Current scheduled medications  docusate sodium  100 mg Oral Daily   famotidine   20 mg Oral Daily   magnesium oxide  400 mg Oral QHS   metroNIDAZOLE   1 Application Vaginal QHS   prenatal multivitamin  1 tablet Oral Q1200    I have reviewed the patient's current medications.  ASSESSMENT: Principal Problem:   Pyelonephritis affecting pregnancy in third trimester    PLAN:  High risk antepartum  - Inpatient for pyelonephritis and  E. Coli bacteremia  - Dr IVAR Dinsmore MD notified of plan of care  - Continue routine antepartum care  - Fetal monitoring: NST daily  - NST due today  - Diet  Regular diet - Activity: as tolerated  - VTE Prophylaxis: ambulation  - Growth US  01/06/24 shows EFW 2565g (50.2%), AFI 14.2cm, cephalic presentation   Pyelonephritis - Last fever 01/06/24 @ 0827, highest temp 101.9 (01/05/24 @ 1955) - Continue Rocephin  IV q24hrs for 5 days and then can transition to PO  - Pending E. Coli susceptibilities. Continue with IV Rocephin  - IVF resuscitation - Tachycardia improving with IV fluid bolus and acetaminophen  - Acetaminophen  for fever > 100.46F or mild pain - CBC and BMP daily  - Urine culture: preliminary result shows E. Coli with susceptibilities pending  - Continuous pulse ox - hospitalist consulted for positive blood cultures, appreciate continued recommendations   BV - Metrogel  0.75% vaginally q night x5   Nausea and vomiting - Zofran  4mg  ODT or IV q6hrs nausea or vomiting - Phenergan  25mg  PO q6hrs PRN refractory nausea  - Diet as tolerated  - Maalox PRN indigestion  - Pepcid  daily    Hypokalemia - Potassium improved from 2.9 -> 3.5  - Received Kcl 10mEq IV x 3 - electrolyte replacement per pharmacy consult - repeat BMP in the AM  Therisa Pillow, CNM Certified Nurse Midwife [facility]

## 2024-01-07 NOTE — Progress Notes (Signed)
 Pt brought to L&D from [facility] for daily NST.

## 2024-01-08 DIAGNOSIS — Z3A35 35 weeks gestation of pregnancy: Secondary | ICD-10-CM | POA: Diagnosis not present

## 2024-01-08 DIAGNOSIS — R7881 Bacteremia: Secondary | ICD-10-CM | POA: Diagnosis not present

## 2024-01-08 DIAGNOSIS — O2303 Infections of kidney in pregnancy, third trimester: Secondary | ICD-10-CM | POA: Diagnosis not present

## 2024-01-08 DIAGNOSIS — B9620 Unspecified Escherichia coli [E. coli] as the cause of diseases classified elsewhere: Secondary | ICD-10-CM

## 2024-01-08 LAB — CULTURE, BLOOD (ROUTINE X 2): Special Requests: ADEQUATE

## 2024-01-08 LAB — CBC
HCT: 27.8 % — ABNORMAL LOW (ref 36.0–46.0)
Hemoglobin: 9.3 g/dL — ABNORMAL LOW (ref 12.0–15.0)
MCH: 28.6 pg (ref 26.0–34.0)
MCHC: 33.5 g/dL (ref 30.0–36.0)
MCV: 85.5 fL (ref 80.0–100.0)
Platelets: 274 K/uL (ref 150–400)
RBC: 3.25 MIL/uL — ABNORMAL LOW (ref 3.87–5.11)
RDW: 13.2 % (ref 11.5–15.5)
WBC: 20 K/uL — ABNORMAL HIGH (ref 4.0–10.5)
nRBC: 0.2 % (ref 0.0–0.2)

## 2024-01-08 LAB — URINE CULTURE: Culture: >=100000 — AB

## 2024-01-08 LAB — BASIC METABOLIC PANEL WITH GFR
Anion gap: 12 (ref 5–15)
BUN: 5 mg/dL — ABNORMAL LOW (ref 6–20)
CO2: 22 mmol/L (ref 22–32)
Calcium: 9.2 mg/dL (ref 8.9–10.3)
Chloride: 105 mmol/L (ref 98–111)
Creatinine, Ser: 0.69 mg/dL (ref 0.44–1.00)
GFR, Estimated: >60 mL/min (ref >60)
Glucose, Bld: 129 mg/dL — ABNORMAL HIGH (ref 70–99)
Potassium: 3.1 mmol/L — ABNORMAL LOW (ref 3.5–5.1)
Sodium: 139 mmol/L (ref 135–145)

## 2024-01-08 MED ORDER — POTASSIUM CHLORIDE 20 MEQ PO PACK
40.0000 meq | PACK | Freq: Once | ORAL | Status: DC
  Filled 2024-01-08: qty 2

## 2024-01-08 MED ORDER — SODIUM CHLORIDE 0.9 % IV SOLN: INTRAVENOUS | Status: DC | PRN

## 2024-01-08 MED ORDER — POTASSIUM CHLORIDE CRYS ER 20 MEQ PO TBCR
40.0000 meq | EXTENDED_RELEASE_TABLET | Freq: Once | ORAL | Status: AC
  Administered 2024-01-08: 40 meq via ORAL
  Filled 2024-01-08: qty 2

## 2024-01-08 MED ORDER — SODIUM CHLORIDE 0.9 % IV SOLN
2.0000 g | INTRAVENOUS | Status: DC
  Administered 2024-01-08 – 2024-01-09 (×2): 2 g via INTRAVENOUS
  Filled 2024-01-08: qty 20

## 2024-01-08 NOTE — Progress Notes (Signed)
 PROGRESS NOTE    Danielle Leon  FMW:[id number] DOB: Dec 27, 2000 DOA: 01/05/2024 PCP: [facility]  Chief Complaint  Patient presents with   Emesis    PT reports one day h/o nausea vomiting; she is [redacted] weeks pregnant G4/P3/AB1 and also having tightening of the area from her umbilicus upwards that comes and goes every few minutes    Hospital Course:  Danielle Leon 23 year old female [redacted] weeks pregnant who presented with fever, vaginal discharge, left-sided flank pain.  Mario abdomen pelvis showed bilateral mild hydronephrosis secondary to pregnancy as well as left kidney stranding and plan pyelonephritis.  She was started on ceftriaxone .  Blood cultures later resulted positive for E. coli.  We were consulted in the context of pyelonephritis and E. coli bacteremia.  Subjective: Night.  Patient still endorsing some flank pain.  No nausea no vomiting.  Objective: Vitals:   01/08/24 0445 01/08/24 0500 01/08/24 0850 01/08/24 1155  BP:   108/69 112/75  Pulse: 99 (!) 109 (!) 116   Resp:   17 16  Temp: 98 F (36.7 C)  98.3 F (36.8 C) 98.3 F (36.8 C)  TempSrc: Oral  Oral Oral  SpO2: 98% 97% 95%   Weight:      Height:        Intake/Output Summary (Last 24 hours) at 01/08/2024 1429 Last data filed at 01/08/2024 1339 Gross per 24 hour  Intake 220 ml  Output 1350 ml  Net -1130 ml   Filed Weights   01/05/24 1430 01/05/24 1440  Weight: 90 kg 90 kg    Examination: General exam: Appears calm and comfortable, NAD Respiratory system: No work of breathing, does appear dyspneic when speaking, symmetric chest wall expansion Cardiovascular system: S1 & S2 heard, RRR.  Gastrointestinal system: Gravid abdomen, bilateral flank tenderness Neuro: Alert and oriented. No focal neurological deficits. Psychiatry: Demonstrates appropriate judgement and insight. Mood & affect appropriate for situation.   Assessment & Plan:  Principal Problem:   Pyelonephritis  affecting pregnancy in third trimester   Sepsis E. coli pyelonephritis E. coli bacteremia - Septic criteria on arrival: Fever, leukocytosis, tachycardia. - Status post IV fluids - E. coli with some resistance though susceptible to ceftriaxone  - Plan for 5 total days of parenteral therapy before transitioning to p.o.  Can receive tomorrow's ceftriaxone  dose earlier than scheduled and DC tomorrow afternoon - Recommend an additional 10 days of cefpodoxime 200 mg twice daily at DC - Have discussed this directly with the patient as well as her midwife. - Continue trending CBC, WBC is responsive.  No fever at this time - As needed Tylenol  for pain control  Bacterial vaginosis - Agree with gynecology service  [redacted] weeks pregnant - Defer obstetric care to primary OB team  Body mass index is 32.02 kg/m. - Outpatient follow up for lifestyle modification and risk factor management  Consultants:  Treatment Team:  Consulting Physician: Bobbette Hansen Team 5, MD Consulting Physician: Curtisha Bendix, DO  Procedures:    Antimicrobials:  Anti-infectives (From admission, onward)    Start     Dose/Rate Route Frequency Ordered Stop   01/05/24 2300  cefTRIAXone  (ROCEPHIN ) 2 g in sodium chloride 0.9 % 100 mL IVPB        2 g 200 mL/hr over 30 Minutes Intravenous Every 24 hours 01/05/24 2013 01/12/24 2259   01/05/24 1730  ceFEPIme (MAXIPIME) 2 g in sodium chloride 0.9 % 100 mL IVPB        2 g 200  mL/hr over 30 Minutes Intravenous  Once 01/05/24 1718 01/05/24 1810   01/05/24 1730  metroNIDAZOLE  (FLAGYL ) IVPB 500 mg        500 mg 100 mL/hr over 60 Minutes Intravenous  Once 01/05/24 1718 01/05/24 2004       Data Reviewed: I have personally reviewed following labs and imaging studies CBC: Recent Labs  Lab 01/05/24 1446 01/06/24 0632 01/07/24 0542 01/08/24 0643  WBC 38.6* 31.5* 21.8* 20.0*  NEUTROABS 30.2*  --   --   --   HGB 10.7* 10.5* 9.0* 9.3*  HCT 31.7* 32.2* 27.6* 27.8*  MCV  84.8 88.5 87.1 85.5  PLT 280 251 243 274   Basic Metabolic Panel: Recent Labs  Lab 01/05/24 1446 01/06/24 0632 01/08/24 0643  NA 133* 135 139  K 2.9* 3.5 3.1*  CL 99 102 105  CO2 17* 17* 22  GLUCOSE 88 89 129*  BUN 8 9 5*  CREATININE 0.85 0.74 0.69  CALCIUM 9.0 9.3 9.2  MG 1.3* 2.5*  --   PHOS  --  2.6  --    GFR: Estimated Creatinine Clearance: 123.6 mL/min (by C-G formula based on SCr of 0.69 mg/dL). Liver Function Tests: Recent Labs  Lab 01/05/24 1446  AST 27  ALT 20  ALKPHOS 192*  BILITOT 1.5*  PROT 6.7  ALBUMIN 3.1*   CBG: No results for input(s): GLUCAP in the last 168 hours.  Recent Results (from the past 240 hours)  Wet prep, genital     Status: Abnormal   Collection Time: 01/02/24  6:56 PM  Result Value Ref Range Status   Yeast Wet Prep HPF POC NONE SEEN NONE SEEN Final   Trich, Wet Prep NONE SEEN NONE SEEN Final   Clue Cells Wet Prep HPF POC PRESENT (A) NONE SEEN Final   WBC, Wet Prep HPF POC <10 <10 Final   Sperm NONE SEEN  Final    Comment: Performed at [facility], [address] [place], [place] [address]  Resp panel by RT-PCR (RSV, Flu A&B, Covid) Anterior Nasal Swab     Status: None   Collection Time: 01/05/24  2:46 PM   Specimen: Anterior Nasal Swab  Result Value Ref Range Status   SARS Coronavirus 2 by RT PCR NEGATIVE NEGATIVE Final    Comment: (NOTE) SARS-CoV-2 target nucleic acids are NOT DETECTED.  The SARS-CoV-2 RNA is generally detectable in upper respiratory specimens during the acute phase of infection. The lowest concentration of SARS-CoV-2 viral copies this assay can detect is 138 copies/mL. A negative result does not preclude SARS-Cov-2 infection and should not be used as the sole basis for treatment or other patient management decisions. A negative result may occur with  improper specimen collection/handling, submission of specimen other than nasopharyngeal swab, presence of viral mutation(s) within the areas  targeted by this assay, and inadequate number of viral copies(<138 copies/mL). A negative result must be combined with clinical observations, patient history, and epidemiological information. The expected result is Negative.  Fact Sheet for Patients:  bloggercourse.com  Fact Sheet for Healthcare Providers:  seriousbroker.it  This test is no t yet approved or cleared by the [place]  FDA and  has been authorized for detection and/or diagnosis of SARS-CoV-2 by FDA under an Emergency Use Authorization (EUA). This EUA will remain  in effect (meaning this test can be used) for the duration of the COVID-19 declaration under Section 564(b)(1) of the Act, 21 U.S.C.section 360bbb-3(b)(1), unless the authorization is terminated  or revoked  sooner.       Influenza A by PCR NEGATIVE NEGATIVE Final   Influenza B by PCR NEGATIVE NEGATIVE Final    Comment: (NOTE) The Xpert Xpress SARS-CoV-2/FLU/RSV plus assay is intended as an aid in the diagnosis of influenza from Nasopharyngeal swab specimens and should not be used as a sole basis for treatment. Nasal washings and aspirates are unacceptable for Xpert Xpress SARS-CoV-2/FLU/RSV testing.  Fact Sheet for Patients: bloggercourse.com  Fact Sheet for Healthcare Providers: seriousbroker.it  This test is not yet approved or cleared by the [place]  FDA and has been authorized for detection and/or diagnosis of SARS-CoV-2 by FDA under an Emergency Use Authorization (EUA). This EUA will remain in effect (meaning this test can be used) for the duration of the COVID-19 declaration under Section 564(b)(1) of the Act, 21 U.S.C. section 360bbb-3(b)(1), unless the authorization is terminated or revoked.     Resp Syncytial Virus by PCR NEGATIVE NEGATIVE Final    Comment: (NOTE) Fact Sheet for  Patients: bloggercourse.com  Fact Sheet for Healthcare Providers: seriousbroker.it  This test is not yet approved or cleared by the [place]  FDA and has been authorized for detection and/or diagnosis of SARS-CoV-2 by FDA under an Emergency Use Authorization (EUA). This EUA will remain in effect (meaning this test can be used) for the duration of the COVID-19 declaration under Section 564(b)(1) of the Act, 21 U.S.C. section 360bbb-3(b)(1), unless the authorization is terminated or revoked.  Performed at [facility], [address] [place], [place] [address]   Blood culture (routine x 2)     Status: None (Preliminary result)   Collection Time: 01/05/24  4:25 PM   Specimen: BLOOD  Result Value Ref Range Status   Specimen Description BLOOD RIGHT ANTECUBITAL  Final   Special Requests   Final    BOTTLES DRAWN AEROBIC AND ANAEROBIC Blood Culture adequate volume   Culture  Setup Time   Final    GRAM NEGATIVE RODS AEROBIC BOTTLE ONLY CRITICAL VALUE NOTED.  VALUE IS CONSISTENT WITH PREVIOUSLY REPORTED AND CALLED VALUE.    Culture   Final    NO GROWTH 3 DAYS Performed at [facility], [address] [place], [place] [address]    Report Status PENDING  Incomplete  Blood culture (routine x 2)     Status: Abnormal   Collection Time: 01/05/24  4:25 PM   Specimen: BLOOD  Result Value Ref Range Status   Specimen Description   Final    BLOOD LEFT ANTECUBITAL Performed at [facility], [address] [place], [place] [address]    Special Requests   Final    BOTTLES DRAWN AEROBIC AND ANAEROBIC Blood Culture adequate volume Performed at [facility], [address] [place], [place] [address]    Culture  Setup Time   Final    IN BOTH AEROBIC AND ANAEROBIC BOTTLES GRAM NEGATIVE RODS CRITICAL RESULT CALLED TO, READ BACK BY AND VERIFIED WITH: RANKIN WENDI GOWER D AT 9381 01/06/24 RAM    Culture  ESCHERICHIA COLI (A)  Final   Report Status 01/08/2024 FINAL  Final   Organism ID, Bacteria ESCHERICHIA COLI  Final      Susceptibility   Escherichia coli - MIC*    AMPICILLIN >=32 RESISTANT Resistant     CEFAZOLIN (NON-URINE) 2 SENSITIVE Sensitive     CEFEPIME <=0.12 SENSITIVE Sensitive     ERTAPENEM <=0.12 SENSITIVE Sensitive     CEFTRIAXONE  <=0.25 SENSITIVE Sensitive     CIPROFLOXACIN  <=0.06  SENSITIVE Sensitive     GENTAMICIN <=1 SENSITIVE Sensitive     MEROPENEM <=0.25 SENSITIVE Sensitive     TRIMETH /SULFA  >=320 RESISTANT Resistant     AMPICILLIN/SULBACTAM 16 INTERMEDIATE Intermediate     PIP/TAZO Value in next row Sensitive      <=4 SENSITIVEThis is a modified FDA-approved test that has been validated and its performance characteristics determined by the reporting laboratory.  This laboratory is certified under the Clinical Laboratory Improvement Amendments CLIA as qualified to perform high complexity clinical laboratory testing.    * ESCHERICHIA COLI  Blood Culture ID Panel (Reflexed)     Status: Abnormal   Collection Time: 01/05/24  4:25 PM  Result Value Ref Range Status   Enterococcus faecalis NOT DETECTED NOT DETECTED Final   Enterococcus Faecium NOT DETECTED NOT DETECTED Final   Listeria monocytogenes NOT DETECTED NOT DETECTED Final   Staphylococcus species NOT DETECTED NOT DETECTED Final   Staphylococcus aureus (BCID) NOT DETECTED NOT DETECTED Final   Staphylococcus epidermidis NOT DETECTED NOT DETECTED Final   Staphylococcus lugdunensis NOT DETECTED NOT DETECTED Final   Streptococcus species NOT DETECTED NOT DETECTED Final   Streptococcus agalactiae NOT DETECTED NOT DETECTED Final   Streptococcus pneumoniae NOT DETECTED NOT DETECTED Final   Streptococcus pyogenes NOT DETECTED NOT DETECTED Final   A.calcoaceticus-baumannii NOT DETECTED NOT DETECTED Final   Bacteroides fragilis NOT DETECTED NOT DETECTED Final   Enterobacterales DETECTED (A) NOT DETECTED Final    Comment:  Enterobacterales represent a large order of gram negative bacteria, not a single organism. CRITICAL RESULT CALLED TO, READ BACK BY AND VERIFIED WITH: RANKIN WENDI GOWER D AT 9381 01/06/24 RAM    Enterobacter cloacae complex NOT DETECTED NOT DETECTED Final   Escherichia coli DETECTED (A) NOT DETECTED Final    Comment: CRITICAL RESULT CALLED TO, READ BACK BY AND VERIFIED WITH: RANKIN WENDI GOWER D AT 0618 01/06/24 RAM    Klebsiella aerogenes NOT DETECTED NOT DETECTED Final   Klebsiella oxytoca NOT DETECTED NOT DETECTED Final   Klebsiella pneumoniae NOT DETECTED NOT DETECTED Final   Proteus species NOT DETECTED NOT DETECTED Final   Salmonella species NOT DETECTED NOT DETECTED Final   Serratia marcescens NOT DETECTED NOT DETECTED Final   Haemophilus influenzae NOT DETECTED NOT DETECTED Final   Neisseria meningitidis NOT DETECTED NOT DETECTED Final   Pseudomonas aeruginosa NOT DETECTED NOT DETECTED Final   Stenotrophomonas maltophilia NOT DETECTED NOT DETECTED Final   Candida albicans NOT DETECTED NOT DETECTED Final   Candida auris NOT DETECTED NOT DETECTED Final   Candida glabrata NOT DETECTED NOT DETECTED Final   Candida krusei NOT DETECTED NOT DETECTED Final   Candida parapsilosis NOT DETECTED NOT DETECTED Final   Candida tropicalis NOT DETECTED NOT DETECTED Final   Cryptococcus neoformans/gattii NOT DETECTED NOT DETECTED Final   CTX-M ESBL NOT DETECTED NOT DETECTED Final   Carbapenem resistance IMP NOT DETECTED NOT DETECTED Final   Carbapenem resistance KPC NOT DETECTED NOT DETECTED Final   Carbapenem resistance NDM NOT DETECTED NOT DETECTED Final   Carbapenem resist OXA 48 LIKE NOT DETECTED NOT DETECTED Final   Carbapenem resistance VIM NOT DETECTED NOT DETECTED Final    Comment: Performed at [facility], [address] [place], [place] [address]  Urine Culture     Status: Abnormal   Collection Time: 01/05/24  6:11 PM   Specimen: Urine, Clean Catch  Result Value Ref Range  Status   Specimen Description   Final    URINE, CLEAN CATCH Performed  at [facility], [address] [place], [place] [address]    Special Requests   Final    NONE Performed at [facility], [address] [place], [place] [address]    Culture >=100,000 COLONIES/mL ESCHERICHIA COLI (A)  Final   Report Status 01/08/2024 FINAL  Final   Organism ID, Bacteria ESCHERICHIA COLI (A)  Final      Susceptibility   Escherichia coli - MIC*    AMPICILLIN >=32 RESISTANT Resistant     CEFAZOLIN (URINE) Value in next row Sensitive      2 SENSITIVEThis is a modified FDA-approved test that has been validated and its performance characteristics determined by the reporting laboratory.  This laboratory is certified under the Clinical Laboratory Improvement Amendments CLIA as qualified to perform high complexity clinical laboratory testing.    CEFEPIME Value in next row Sensitive      2 SENSITIVEThis is a modified FDA-approved test that has been validated and its performance characteristics determined by the reporting laboratory.  This laboratory is certified under the Clinical Laboratory Improvement Amendments CLIA as qualified to perform high complexity clinical laboratory testing.    ERTAPENEM Value in next row Sensitive      2 SENSITIVEThis is a modified FDA-approved test that has been validated and its performance characteristics determined by the reporting laboratory.  This laboratory is certified under the Clinical Laboratory Improvement Amendments CLIA as qualified to perform high complexity clinical laboratory testing.    CEFTRIAXONE  Value in next row Sensitive      2 SENSITIVEThis is a modified FDA-approved test that has been validated and its performance characteristics determined by the reporting laboratory.  This laboratory is certified under the Clinical Laboratory Improvement Amendments CLIA as qualified to perform high complexity clinical laboratory testing.    CIPROFLOXACIN   Value in next row Sensitive      2 SENSITIVEThis is a modified FDA-approved test that has been validated and its performance characteristics determined by the reporting laboratory.  This laboratory is certified under the Clinical Laboratory Improvement Amendments CLIA as qualified to perform high complexity clinical laboratory testing.    GENTAMICIN Value in next row Sensitive      2 SENSITIVEThis is a modified FDA-approved test that has been validated and its performance characteristics determined by the reporting laboratory.  This laboratory is certified under the Clinical Laboratory Improvement Amendments CLIA as qualified to perform high complexity clinical laboratory testing.    NITROFURANTOIN Value in next row Sensitive      2 SENSITIVEThis is a modified FDA-approved test that has been validated and its performance characteristics determined by the reporting laboratory.  This laboratory is certified under the Clinical Laboratory Improvement Amendments CLIA as qualified to perform high complexity clinical laboratory testing.    TRIMETH /SULFA  Value in next row Resistant      2 SENSITIVEThis is a modified FDA-approved test that has been validated and its performance characteristics determined by the reporting laboratory.  This laboratory is certified under the Clinical Laboratory Improvement Amendments CLIA as qualified to perform high complexity clinical laboratory testing.    AMPICILLIN/SULBACTAM Value in next row Intermediate      2 SENSITIVEThis is a modified FDA-approved test that has been validated and its performance characteristics determined by the reporting laboratory.  This laboratory is certified under the Clinical Laboratory Improvement Amendments CLIA as qualified to perform high complexity clinical laboratory testing.    PIP/TAZO Value in next row Sensitive      <=4 SENSITIVEThis is a modified  FDA-approved test that has been validated and its performance characteristics determined by the  reporting laboratory.  This laboratory is certified under the Clinical Laboratory Improvement Amendments CLIA as qualified to perform high complexity clinical laboratory testing.    MEROPENEM Value in next row Sensitive      <=4 SENSITIVEThis is a modified FDA-approved test that has been validated and its performance characteristics determined by the reporting laboratory.  This laboratory is certified under the Clinical Laboratory Improvement Amendments CLIA as qualified to perform high complexity clinical laboratory testing.    * >=100,000 COLONIES/mL ESCHERICHIA COLI     Radiology Studies: US  OB Comp + 14 Wk Result Date: 01/06/2024 CLINICAL DATA:  Pyelonephritis. EXAM: OBSTETRICAL ULTRASOUND >14 WKS AND TRANSVAGINAL OB US  FINDINGS: Number of Fetuses: 1 Heart Rate: 123 bpm Movement: Yes Presentation: Cephalic Previa: No Placental Location: Anterior Amniotic Fluid (Subjective): Normal Amniotic Fluid (Objective): Vertical pocket N/Acm AFI 14.2 cm (5%ile= 8.1 cm, 95%= 24.8 cm for 34 wks) FETAL BIOMETRY BPD:  8.91cm 36w 0d HC:    32.48cm 36w 5d AC:    30.48cm 34w 3d FL:    6.81cm 35w 0d Current Mean GA: 35w 2d    US  EDC: February 08, 2024 Assigned GA: 34w 6d    Assigned [facility]: February 11, 2024 Estimated Fetal Weight: 2565.7g    50.2%ile FETAL ANATOMY Lateral Ventricles: Appears normal Thalami/CSP: Appears normal Posterior Fossa: Not visualized Nuchal Region: Not visualized    NFT= N/A Upper Lip: Appears normal Spine: Not visualized 4 Chamber Heart on Left: Appears normal LVOT: Appears normal RVOT: Appears normal Stomach on Left: Appears normal 3 Vessel Cord: Appears normal Cord Insertion site: Not visualized Kidneys: Appears normal Bladder: Appears normal Extremities: Appears normal Sex: Female Technical Limitations: Fetal position and advanced gestational age. Maternal Findings: Cervix:  N/A IMPRESSION: 1. A single, viable intrauterine pregnancy at approximately 34 weeks and 6 days gestation by ultrasound  evaluation. 2. Limited study without evidence of fetal anatomy abnormality. Electronically Signed   By: Suzen Dials M.D.   On: 01/06/2024 15:53    Scheduled Meds:  docusate sodium  100 mg Oral Daily   famotidine   20 mg Oral BID   magnesium oxide  400 mg Oral QHS   metroNIDAZOLE   1 Application Vaginal QHS   prenatal multivitamin  1 tablet Oral Q1200   Continuous Infusions:  cefTRIAXone  (ROCEPHIN )  IV Stopped (01/07/24 2331)     LOS: 3 days  MDM: Patient is high risk for one or more organ failure.  They necessitate ongoing hospitalization for continued IV therapies and subsequent lab monitoring. Total time spent interpreting labs and vitals, reviewing the medical record, coordinating care amongst consultants and care team members, directly assessing and discussing care with the patient and/or family: 55 min  Lenda Baratta, DO [facility]  To contact the attending physician between 7A-7P please use Epic Chat. To contact the covering physician during after hours 7P-7A, please review Amion.  01/08/2024, 2:29 PM   *This document has been created with the assistance of dictation software. Please excuse typographical errors. *

## 2024-01-08 NOTE — Progress Notes (Signed)
 ANTEPARTUM PROGRESS NOTE  Danielle Leon is a 23 y.o. [phone] at [redacted]w[redacted]d who is admitted for pyelonephritis and E. Coli bacteremia.    Estimated Date of Delivery: 02/11/24  Length of Stay:  3 Days. Admitted 01/05/2024  Subjective: Reports continued headache and severe heartburn. Improves with medication. Using Maalox and Tums for heartburn.   She reports:  -active fetal movement -no leakage of fluid -no vaginal bleeding -no contractions  Vitals:  BP 108/69 (BP Location: Left Arm)   Pulse (!) 116   Temp 98.3 F (36.8 C) (Oral)   Resp 17   Ht 5' 6 (1.676 m)   Wt 90 kg   LMP 02/14/2023 (Approximate)   SpO2 95%   BMI 32.02 kg/m  Physical Examination: General:   alert, cooperative, and no distress  Skin:  normal  Neurologic:    Alert & oriented x 3  Lungs:  Normal respiratory effort  Abdomen: gravid and non-tender  Pelvis:  Exam deferred.  Extremities: : non-tender, symmetric, No edema bilaterally.  DTRs: 2+/2+      Results for orders placed or performed during the hospital encounter of 01/05/24 (from the past 48 hours)  Lactic acid, plasma     Status: None   Collection Time: 01/06/24  1:38 PM  Result Value Ref Range   Lactic Acid, Venous 1.0 0.5 - 1.9 mmol/L    Comment: Performed at [facility], [address] [place], [place] [address]  Lactic acid, plasma     Status: None   Collection Time: 01/06/24  4:02 PM  Result Value Ref Range   Lactic Acid, Venous 1.0 0.5 - 1.9 mmol/L    Comment: Performed at [facility], [address] [place], [place] [address]  CBC     Status: Abnormal   Collection Time: 01/07/24  5:42 AM  Result Value Ref Range   WBC 21.8 (H) 4.0 - 10.5 K/uL   RBC 3.17 (L) 3.87 - 5.11 MIL/uL   Hemoglobin 9.0 (L) 12.0 - 15.0 g/dL   HCT 72.3 (L) 63.9 - 53.9 %   MCV 87.1 80.0 - 100.0 fL   MCH 28.4 26.0 - 34.0 pg   MCHC 32.6 30.0 - 36.0 g/dL   RDW 86.9 88.4 - 84.4 %   Platelets 243 150 - 400 K/uL   nRBC 0.2 0.0 - 0.2 %     Comment: Performed at [facility], [address] [place], [place] [address]  CBC     Status: Abnormal   Collection Time: 01/08/24  6:43 AM  Result Value Ref Range   WBC 20.0 (H) 4.0 - 10.5 K/uL   RBC 3.25 (L) 3.87 - 5.11 MIL/uL   Hemoglobin 9.3 (L) 12.0 - 15.0 g/dL   HCT 72.1 (L) 63.9 - 53.9 %   MCV 85.5 80.0 - 100.0 fL   MCH 28.6 26.0 - 34.0 pg   MCHC 33.5 30.0 - 36.0 g/dL   RDW 86.7 88.4 - 84.4 %   Platelets 274 150 - 400 K/uL   nRBC 0.2 0.0 - 0.2 %    Comment: Performed at [facility], [address], [place], [place] [address]  Basic metabolic panel     Status: Abnormal   Collection Time: 01/08/24  6:43 AM  Result Value Ref Range   Sodium 139 135 - 145 mmol/L   Potassium 3.1 (L) 3.5 - 5.1 mmol/L   Chloride 105 98 - 111 mmol/L   CO2 22 22 - 32 mmol/L   Glucose,  Bld 129 (H) 70 - 99 mg/dL    Comment: Glucose reference range applies only to samples taken after fasting for at least 8 hours.   BUN 5 (L) 6 - 20 mg/dL   Creatinine, Ser 9.30 0.44 - 1.00 mg/dL   Calcium 9.2 8.9 - 89.6 mg/dL   GFR, Estimated >39 >39 mL/min    Comment: (NOTE) Calculated using the CKD-EPI Creatinine Equation (2021)    Anion gap 12 5 - 15    Comment: Performed at [facility], [address] [place], [place] [address]    US  MAINE Comp + 14 Wk Result Date: 01/06/2024 CLINICAL DATA:  Pyelonephritis. EXAM: OBSTETRICAL ULTRASOUND >14 WKS AND TRANSVAGINAL OB US  FINDINGS: Number of Fetuses: 1 Heart Rate: 123 bpm Movement: Yes Presentation: Cephalic Previa: No Placental Location: Anterior Amniotic Fluid (Subjective): Normal Amniotic Fluid (Objective): Vertical pocket N/Acm AFI 14.2 cm (5%ile= 8.1 cm, 95%= 24.8 cm for 34 wks) FETAL BIOMETRY BPD:  8.91cm 36w 0d HC:    32.48cm 36w 5d AC:    30.48cm 34w 3d FL:    6.81cm 35w 0d Current Mean GA: 35w 2d    US  EDC: February 08, 2024 Assigned GA: 34w 6d    Assigned [facility]: February 11, 2024 Estimated Fetal Weight: 2565.7g    50.2%ile FETAL  ANATOMY Lateral Ventricles: Appears normal Thalami/CSP: Appears normal Posterior Fossa: Not visualized Nuchal Region: Not visualized    NFT= N/A Upper Lip: Appears normal Spine: Not visualized 4 Chamber Heart on Left: Appears normal LVOT: Appears normal RVOT: Appears normal Stomach on Left: Appears normal 3 Vessel Cord: Appears normal Cord Insertion site: Not visualized Kidneys: Appears normal Bladder: Appears normal Extremities: Appears normal Sex: Female Technical Limitations: Fetal position and advanced gestational age. Maternal Findings: Cervix:  N/A IMPRESSION: 1. A single, viable intrauterine pregnancy at approximately 34 weeks and 6 days gestation by ultrasound evaluation. 2. Limited study without evidence of fetal anatomy abnormality. Electronically Signed   By: Suzen Dials M.D.   On: 01/06/2024 15:53    Current scheduled medications  docusate sodium  100 mg Oral Daily   famotidine   20 mg Oral BID   magnesium oxide  400 mg Oral QHS   metroNIDAZOLE   1 Application Vaginal QHS   prenatal multivitamin  1 tablet Oral Q1200    I have reviewed the patient's current medications.  ASSESSMENT: Principal Problem:   Pyelonephritis affecting pregnancy in third trimester    PLAN:  High risk antepartum  - Inpatient for pyelonephritis and E. Coli bacteremia  - Continue routine antepartum care  - Fetal monitoring: NST daily  - Last NST yesterday around 1430  - Diet Regular diet - Activity: as tolerated  - VTE Prophylaxis: ambulation  - Growth US  01/06/24 shows EFW 2565g (50.2%), AFI 14.2cm, cephalic presentation   Pyelonephritis - WBC 38.6 -> 31.5 -> 21.8 -> 20.0 - Last fever 01/06/24 @ 0827, highest temp 101.9 (01/05/24 @ 1955) - Continue Rocephin  IV q24hrs for 5 days (Started 01/05/24), then will transition to PO  - E. Coli susceptibilities completed. Will continue with IV Rocephin  - Continue IVF  - Tachycardia treated with IV fluid bolus and acetaminophen  - CBC and BMP daily  -  Continuous pulse ox - Hospitalist consulted for positive blood cultures, appreciate continued recommendations   BV - Metrogel  0.75% vaginally q night x5 (started 01/06/24)   Nausea and vomiting - Zofran  4mg  ODT or IV q6hrs nausea or vomiting - Phenergan  25mg  PO q6hrs PRN refractory nausea  - Diet  as tolerated  - Maalox and Tums PRN indigestion  - Pepcid  daily    Hypokalemia - Potassium improved from 2.9 -> 3.5 -> 3.1 - Electrolyte replacement per pharmacy consult   Delon Coe, CNM Certified Nurse Midwife [facility]

## 2024-01-08 NOTE — Progress Notes (Signed)
 Pt brought over to L&D for daily NST.

## 2024-01-08 NOTE — Plan of Care (Signed)
 Pt progressing towards goals.

## 2024-01-09 LAB — BASIC METABOLIC PANEL WITH GFR
Anion gap: 9 (ref 5–15)
BUN: <5 mg/dL — ABNORMAL LOW (ref 6–20)
CO2: 26 mmol/L (ref 22–32)
Calcium: 8.7 mg/dL — ABNORMAL LOW (ref 8.9–10.3)
Chloride: 105 mmol/L (ref 98–111)
Creatinine, Ser: 0.65 mg/dL (ref 0.44–1.00)
GFR, Estimated: >60 mL/min (ref >60)
Glucose, Bld: 73 mg/dL (ref 70–99)
Potassium: 3.5 mmol/L (ref 3.5–5.1)
Sodium: 139 mmol/L (ref 135–145)

## 2024-01-09 LAB — CBC
HCT: 27.4 % — ABNORMAL LOW (ref 36.0–46.0)
Hemoglobin: 8.8 g/dL — ABNORMAL LOW (ref 12.0–15.0)
MCH: 28.1 pg (ref 26.0–34.0)
MCHC: 32.1 g/dL (ref 30.0–36.0)
MCV: 87.5 fL (ref 80.0–100.0)
Platelets: 303 K/uL (ref 150–400)
RBC: 3.13 MIL/uL — ABNORMAL LOW (ref 3.87–5.11)
RDW: 13.6 % (ref 11.5–15.5)
WBC: 18.4 K/uL — ABNORMAL HIGH (ref 4.0–10.5)
nRBC: 0.3 % — ABNORMAL HIGH (ref 0.0–0.2)

## 2024-01-09 MED ORDER — ONDANSETRON 4 MG PO TBDP: 4.0000 mg | ORAL_TABLET | Freq: Four times a day (QID) | ORAL | 0 refills | Status: AC | PRN

## 2024-01-09 MED ORDER — METRONIDAZOLE 0.75 % VA GEL: 1.0000 | Freq: Every day | VAGINAL | 0 refills | Status: AC

## 2024-01-09 MED ORDER — DOCUSATE SODIUM 100 MG PO CAPS: 100.0000 mg | ORAL_CAPSULE | Freq: Every day | ORAL | Status: AC

## 2024-01-09 MED ORDER — CEPHALEXIN 500 MG PO CAPS
500.0000 mg | ORAL_CAPSULE | Freq: Four times a day (QID) | ORAL | 0 refills | Status: AC
Start: 2024-01-09 — End: 2024-01-23

## 2024-01-09 NOTE — Progress Notes (Signed)
 Patient discharged. Discharge instructions given. Patient verbalizes understanding. Transported by axillary.
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
## 2024-01-09 NOTE — Progress Notes (Signed)
 I concur with student charting.

## 2024-01-10 LAB — CULTURE, BLOOD (ROUTINE X 2)
Culture  Setup Time: NO GROWTH
Report Status: NO GROWTH — AB
Special Requests: ADEQUATE

## 2024-01-13 DIAGNOSIS — R7881 Bacteremia: Secondary | ICD-10-CM

## 2024-01-13 DIAGNOSIS — Z3A35 35 weeks gestation of pregnancy: Secondary | ICD-10-CM

## 2024-01-22 DIAGNOSIS — Z3A34 34 weeks gestation of pregnancy: Secondary | ICD-10-CM

## 2024-02-23 ENCOUNTER — Ambulatory Visit
Admission: EM | Admit: 2024-02-23 | Discharge: 2024-02-23 | Disposition: A | Discharge status: Home / Self Care | Attending: Family Medicine | Admitting: Family Medicine

## 2024-02-23 DIAGNOSIS — T8149XA Infection following a procedure, other surgical site, initial encounter: Secondary | ICD-10-CM | POA: Diagnosis not present

## 2024-02-23 DIAGNOSIS — R300 Dysuria: Secondary | ICD-10-CM | POA: Insufficient documentation

## 2024-02-23 DIAGNOSIS — N899 Noninflammatory disorder of vagina, unspecified: Secondary | ICD-10-CM

## 2024-02-23 DIAGNOSIS — N3001 Acute cystitis with hematuria: Secondary | ICD-10-CM | POA: Insufficient documentation

## 2024-02-23 LAB — POCT URINE DIPSTICK
Bilirubin, UA: NEGATIVE
Glucose, UA: NEGATIVE mg/dL
Ketones, POC UA: NEGATIVE mg/dL
Nitrite, UA: POSITIVE — AB
Protein Ur, POC: 100 mg/dL — AB
Spec Grav, UA: 1.015
Urobilinogen, UA: 0.2 U/dL
pH, UA: 5.5

## 2024-02-23 MED ORDER — CEFDINIR 300 MG PO CAPS: 300.0000 mg | ORAL_CAPSULE | Freq: Two times a day (BID) | ORAL | 0 refills | Status: AC
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
## 2024-02-23 NOTE — ED Triage Notes (Addendum)
 Pt c/o urinary burning x1week  Pt states that she has an infection along her C-section done on 12.13.25 and was given Bactrim  on 12.30.25  Pt states that she has vaginal swelling

## 2024-02-23 NOTE — Discharge Instructions (Signed)

## 2024-02-25 LAB — CERVICOVAGINAL ANCILLARY ONLY
Bacterial Vaginitis (gardnerella): NEGATIVE
Candida Glabrata: NEGATIVE
Candida Vaginitis: NEGATIVE
Comment: NEGATIVE
Comment: NEGATIVE
Comment: NEGATIVE

## 2024-02-26 ENCOUNTER — Ambulatory Visit (HOSPITAL_COMMUNITY): Payer: Self-pay

## 2024-02-26 LAB — URINE CULTURE
Culture: >=100000 — AB
Special Requests: NORMAL
